# Patient Record
Sex: Female | Born: 1960 | Race: White | Hispanic: No | Marital: Married | Smoking: Never smoker
Health system: Southern US, Community
[De-identification: ages and names within clinical notes are randomized; demographics above are authoritative.]

## PROBLEM LIST (undated history)

## (undated) DIAGNOSIS — I10 Essential (primary) hypertension: Secondary | ICD-10-CM

## (undated) DIAGNOSIS — R112 Nausea with vomiting, unspecified: Secondary | ICD-10-CM

## (undated) DIAGNOSIS — R011 Cardiac murmur, unspecified: Secondary | ICD-10-CM

## (undated) DIAGNOSIS — Z79890 Hormone replacement therapy (postmenopausal): Secondary | ICD-10-CM

## (undated) DIAGNOSIS — Z9889 Other specified postprocedural states: Secondary | ICD-10-CM

## (undated) HISTORY — PX: SHOULDER ARTHROSCOPY: SHX128

## (undated) HISTORY — PX: ELBOW SURGERY: SHX618

## (undated) HISTORY — DX: Cardiac murmur, unspecified: R01.1

## (undated) HISTORY — DX: Hormone replacement therapy: Z79.890

## (undated) HISTORY — PX: KNEE ARTHROSCOPY: SHX127

## (undated) HISTORY — PX: TONSILLECTOMY: SUR1361

## (undated) HISTORY — PX: EYE SURGERY: SHX253

## (undated) HISTORY — PX: TUBAL LIGATION: SHX77

## (undated) HISTORY — PX: APPENDECTOMY: SHX54

## (undated) HISTORY — PX: POLYPECTOMY: SHX149

---

## 1998-05-15 ENCOUNTER — Emergency Department (HOSPITAL_COMMUNITY): Admission: EM | Admit: 1998-05-15 | Discharge: 1998-05-16 | Payer: Self-pay | Admitting: Emergency Medicine

## 1998-05-15 ENCOUNTER — Encounter: Payer: Self-pay | Admitting: Emergency Medicine

## 1998-10-22 ENCOUNTER — Other Ambulatory Visit: Admission: RE | Admit: 1998-10-22 | Discharge: 1998-10-22 | Payer: Self-pay | Admitting: Obstetrics and Gynecology

## 1999-09-05 ENCOUNTER — Ambulatory Visit (HOSPITAL_COMMUNITY): Admission: RE | Admit: 1999-09-05 | Discharge: 1999-09-05 | Payer: Self-pay | Admitting: Orthopedic Surgery

## 1999-11-10 ENCOUNTER — Other Ambulatory Visit: Admission: RE | Admit: 1999-11-10 | Discharge: 1999-11-10 | Payer: Self-pay | Admitting: Obstetrics and Gynecology

## 2000-02-11 ENCOUNTER — Ambulatory Visit (HOSPITAL_COMMUNITY): Admission: RE | Admit: 2000-02-11 | Discharge: 2000-02-11 | Payer: Self-pay | Admitting: Orthopedic Surgery

## 2000-11-19 ENCOUNTER — Other Ambulatory Visit: Admission: RE | Admit: 2000-11-19 | Discharge: 2000-11-19 | Payer: Self-pay | Admitting: Obstetrics and Gynecology

## 2001-12-20 ENCOUNTER — Other Ambulatory Visit: Admission: RE | Admit: 2001-12-20 | Discharge: 2001-12-20 | Payer: Self-pay | Admitting: Obstetrics and Gynecology

## 2003-01-26 ENCOUNTER — Other Ambulatory Visit: Admission: RE | Admit: 2003-01-26 | Discharge: 2003-01-26 | Payer: Self-pay | Admitting: Obstetrics and Gynecology

## 2003-11-12 ENCOUNTER — Encounter: Admission: RE | Admit: 2003-11-12 | Discharge: 2003-11-12 | Payer: Self-pay | Admitting: Obstetrics and Gynecology

## 2004-01-30 ENCOUNTER — Other Ambulatory Visit: Admission: RE | Admit: 2004-01-30 | Discharge: 2004-01-30 | Payer: Self-pay | Admitting: Obstetrics and Gynecology

## 2004-05-21 ENCOUNTER — Ambulatory Visit (HOSPITAL_COMMUNITY): Admission: RE | Admit: 2004-05-21 | Discharge: 2004-05-21 | Payer: Self-pay | Admitting: Obstetrics and Gynecology

## 2004-05-21 ENCOUNTER — Encounter (INDEPENDENT_AMBULATORY_CARE_PROVIDER_SITE_OTHER): Payer: Self-pay | Admitting: *Deleted

## 2005-02-13 ENCOUNTER — Other Ambulatory Visit: Admission: RE | Admit: 2005-02-13 | Discharge: 2005-02-13 | Payer: Self-pay | Admitting: Obstetrics and Gynecology

## 2005-03-12 ENCOUNTER — Ambulatory Visit (HOSPITAL_COMMUNITY): Admission: RE | Admit: 2005-03-12 | Discharge: 2005-03-13 | Payer: Self-pay | Admitting: Orthopedic Surgery

## 2007-04-13 ENCOUNTER — Encounter: Admission: RE | Admit: 2007-04-13 | Discharge: 2007-04-13 | Payer: Self-pay | Admitting: Obstetrics and Gynecology

## 2007-11-15 ENCOUNTER — Ambulatory Visit (HOSPITAL_BASED_OUTPATIENT_CLINIC_OR_DEPARTMENT_OTHER): Admission: RE | Admit: 2007-11-15 | Discharge: 2007-11-16 | Payer: Self-pay | Admitting: Orthopedic Surgery

## 2009-04-22 ENCOUNTER — Encounter (INDEPENDENT_AMBULATORY_CARE_PROVIDER_SITE_OTHER): Payer: Self-pay | Admitting: *Deleted

## 2009-05-21 ENCOUNTER — Ambulatory Visit: Payer: Self-pay | Admitting: Gastroenterology

## 2009-05-21 ENCOUNTER — Encounter (INDEPENDENT_AMBULATORY_CARE_PROVIDER_SITE_OTHER): Payer: Self-pay | Admitting: *Deleted

## 2009-05-21 DIAGNOSIS — F329 Major depressive disorder, single episode, unspecified: Secondary | ICD-10-CM

## 2009-05-21 DIAGNOSIS — F3289 Other specified depressive episodes: Secondary | ICD-10-CM | POA: Insufficient documentation

## 2009-05-21 DIAGNOSIS — R198 Other specified symptoms and signs involving the digestive system and abdomen: Secondary | ICD-10-CM | POA: Insufficient documentation

## 2009-05-21 DIAGNOSIS — K589 Irritable bowel syndrome without diarrhea: Secondary | ICD-10-CM

## 2009-05-21 LAB — CONVERTED CEMR LAB
ALT: 12 units/L (ref 0–35)
AST: 21 units/L (ref 0–37)
Albumin: 3.5 g/dL (ref 3.5–5.2)
Alkaline Phosphatase: 61 units/L (ref 39–117)
BUN: 11 mg/dL (ref 6–23)
Basophils Absolute: 0 10*3/uL (ref 0.0–0.1)
Basophils Relative: 0.3 % (ref 0.0–3.0)
Bilirubin, Direct: 0.1 mg/dL (ref 0.0–0.3)
CO2: 26 meq/L (ref 19–32)
Calcium: 8.4 mg/dL (ref 8.4–10.5)
Chloride: 107 meq/L (ref 96–112)
Creatinine, Ser: 0.8 mg/dL (ref 0.4–1.2)
Eosinophils Absolute: 0 10*3/uL (ref 0.0–0.7)
Eosinophils Relative: 0.3 % (ref 0.0–5.0)
Ferritin: 17 ng/mL (ref 10.0–291.0)
Folate: 7.3 ng/mL
GFR calc non Af Amer: 87.05 mL/min (ref >60)
Glucose, Bld: 85 mg/dL (ref 70–99)
HCT: 41.9 % (ref 36.0–46.0)
Hemoglobin: 14.7 g/dL (ref 12.0–15.0)
IgA: 177 mg/dL (ref 68–378)
Iron: 104 ug/dL (ref 42–145)
Lymphocytes Relative: 26.3 % (ref 12.0–46.0)
Lymphs Abs: 1.4 10*3/uL (ref 0.7–4.0)
MCHC: 35 g/dL (ref 30.0–36.0)
MCV: 93.8 fL (ref 78.0–100.0)
Monocytes Absolute: 0.2 10*3/uL (ref 0.1–1.0)
Monocytes Relative: 4.3 % (ref 3.0–12.0)
Neutro Abs: 3.6 10*3/uL (ref 1.4–7.7)
Neutrophils Relative %: 68.8 % (ref 43.0–77.0)
Platelets: 229 10*3/uL (ref 150.0–400.0)
Potassium: 3.7 meq/L (ref 3.5–5.1)
RBC: 4.47 M/uL (ref 3.87–5.11)
RDW: 13.1 % (ref 11.5–14.6)
Saturation Ratios: 32 % (ref 20.0–50.0)
Sed Rate: 11 mm/hr (ref 0–22)
Sodium: 141 meq/L (ref 135–145)
TSH: 1.39 microintl units/mL (ref 0.35–5.50)
Tissue Transglutaminase Ab, IgA: 5.4 units (ref <20)
Total Bilirubin: 0.3 mg/dL (ref 0.3–1.2)
Total Protein: 6.1 g/dL (ref 6.0–8.3)
Transferrin: 231.8 mg/dL (ref 212.0–360.0)
Vitamin B-12: 279 pg/mL (ref 211–911)
WBC: 5.2 10*3/uL (ref 4.5–10.5)

## 2009-05-27 ENCOUNTER — Ambulatory Visit: Payer: Self-pay | Admitting: Gastroenterology

## 2009-05-28 ENCOUNTER — Encounter: Payer: Self-pay | Admitting: Gastroenterology

## 2009-05-29 ENCOUNTER — Encounter: Payer: Self-pay | Admitting: Gastroenterology

## 2010-02-04 NOTE — Letter (Signed)
Summary: [facility] Instructions  [facility]  [address] [place], [place] [address]   Phone: [phone]  Fax: [phone]       Susan Ramirez    [birth year]    MRN: [id number]        Procedure Day /Date: Monday, 05/27/09     Arrival Time: 12:30      Procedure Time: 1:30     Location of Procedure:                    Susan Ramirez _  [facility] (4th Floor)                        PREPARATION FOR COLONOSCOPY WITH MOVIPREP   Starting 5 days prior to your procedure 05/22/09 do not eat nuts, seeds, popcorn, corn, beans, peas,  salads, or any raw vegetables.  Do not take any fiber supplements (e.g. Metamucil, Citrucel, and Benefiber).  THE DAY BEFORE YOUR PROCEDURE         DATE: 05/26/09  DAY: Sunday  1.  Drink clear liquids the entire day-NO SOLID FOOD  2.  Do not drink anything colored red or purple.  Avoid juices with pulp.  No orange juice.  3.  Drink at least 64 oz. (8 glasses) of fluid/clear liquids during the day to prevent dehydration and help the prep work efficiently.  CLEAR LIQUIDS INCLUDE: Water Jello Ice Popsicles Tea (sugar ok, no milk/cream) Powdered fruit flavored drinks Coffee (sugar ok, no milk/cream) Gatorade Juice: apple, white grape, white cranberry  Lemonade Clear bullion, consomm, broth Carbonated beverages (any kind) Strained chicken noodle soup Hard Candy                             4.  In the morning, mix first dose of MoviPrep solution:    Empty 1 Pouch A and 1 Pouch B into the disposable container    Add lukewarm drinking water to the top line of the container. Mix to dissolve    Refrigerate (mixed solution should be used within 24 hrs)  5.  Begin drinking the prep at 5:00 p.m. The MoviPrep container is divided by 4 marks.   Every 15 minutes drink the solution down to the next mark (approximately 8 oz) until the full liter is complete.   6.  Follow completed prep with 16 oz of clear liquid of your choice (Nothing red or  purple).  Continue to drink clear liquids until bedtime.  7.  Before going to bed, mix second dose of MoviPrep solution:    Empty 1 Pouch A and 1 Pouch B into the disposable container    Add lukewarm drinking water to the top line of the container. Mix to dissolve    Refrigerate  THE DAY OF YOUR PROCEDURE      DATE: 05/27/09   DAY: Monday  Beginning at 8:30_a.m. (5 hours before procedure):         1. Every 15 minutes, drink the solution down to the next mark (approx 8 oz) until the full liter is complete.  2. Follow completed prep with 16 oz. of clear liquid of your choice.    3. You may drink clear liquids until _  _ (2 HOURS BEFORE PROCEDURE).   MEDICATION INSTRUCTIONS  Unless otherwise instructed, you should take regular prescription medications with a small sip of water   as early as possible the morning  of your procedure.                  OTHER INSTRUCTIONS  You will need a responsible adult at least 51 years of age to accompany you and drive you home.   This person must remain in the waiting room during your procedure.  Wear loose fitting clothing that is easily removed.  Leave jewelry and other valuables at home.  However, you may wish to bring a book to read or  an iPod/MP3 player to listen to music as you wait for your procedure to start.  Remove all body piercing jewelry and leave at home.  Total time from sign-in until discharge is approximately 2-3 hours.  You should go home directly after your procedure and rest.  You can resume normal activities the  day after your procedure.  The day of your procedure you should not:   Drive   Make legal decisions   Operate machinery   Drink alcohol   Return to work  You will receive specific instructions about eating, activities and medications before you leave.    The above instructions have been reviewed and explained to me by   _______________________    I fully understand and can verbalize  these instructions _____________________________ Date _________

## 2010-02-04 NOTE — Procedures (Signed)
Summary: Colonoscopy  Patient: Susan Ramirez Note: All result statuses are Final unless otherwise noted.  Tests: (1) Colonoscopy (COL)   COL Colonoscopy           DONE     [facility]     [address]     [place], [place]  [address]           COLONOSCOPY PROCEDURE REPORT           PATIENT:  Susan Ramirez, Susan Ramirez  MR#:  [id number]     BIRTHDATE:  1960/11/11, 48 yrs. old  GENDER:  female     ENDOSCOPIST:  Vania Rea. Jarold Motto, MD, [facility]     REF. BY:  Harold Hedge, M.D.     PROCEDURE DATE:  05/27/2009     PROCEDURE:  Colonoscopy with biopsy and snare polypectomy     ASA CLASS:  Class I     INDICATIONS:  colorectal cancer screening, average risk,     unexplained diarrhea     MEDICATIONS:   Fentanyl 75 mcg IV, Versed 8 mg IV           DESCRIPTION OF PROCEDURE:   After the risks benefits and     alternatives of the procedure were thoroughly explained, informed     consent was obtained.  Digital rectal exam was performed and     revealed no abnormalities.   The LB [id number] endoscope     was introduced through the anus and advanced to the terminal ileum     which was intubated for a short distance, without limitations.     The quality of the prep was excellent, using MoviPrep.  The     instrument was then slowly withdrawn as the colon was fully     examined.     <<PROCEDUREIMAGES>>     FINDINGS:  ENDOSCOPIC FINDINGS:  A sessile polyp was found at the     splenic flexure. .5 cm flat polyp hot snare excised and to path.     This was otherwise a normal examination of the colon. RANDOM     BIOPSIES DONE.   Retroflexed views in the rectum revealed no     abnormalities.    The scope was then withdrawn from the patient     and the procedure completed.           COMPLICATIONS:  None     ENDOSCOPIC IMPRESSION:     1) Sessile polyp at the splenic flexure     2) Otherwise normal examination     1.R/O ADENOMA     2.R/O MICROSCOPIC/COLLAGENOUS COLITIS     RECOMMENDATIONS:     1)  Await pathology results     2) Repeat colonoscopy in 5 years if polyp adenomatous; otherwise     10 years     REPEAT EXAM:  No           ______________________________     Vania Rea. Jarold Motto, MD, [facility]           CC:  Mila Palmer, MD           n.     Rosalie Doctor:   Vania Rea. Patterson at 05/27/2009 02:11 PM           Delma Post, [phone]  Note: An exclamation mark (!) indicates a result that was not dispersed into the flowsheet. Document Creation Date: 05/27/2009 2:12 PM _______________________________________________________________________  (1) Order result status: Final Collection or observation date-time: 05/27/2009 14:03 Requested date-time:  Receipt date-time:  Reported date-time:  Referring Physician:   Ordering Physician: Sheryn Bison [phone]) Specimen Source:  Source: Launa Grill Order Number: [id number] Lab site:   Appended Document: Colonoscopy     Procedures Next Due Date:    Colonoscopy: 05/2014

## 2010-02-04 NOTE — Assessment & Plan Note (Signed)
Summary: DIARRHEA...AS.   History of Present Illness Visit Type: Initial Consult Primary GI MD: Sheryn Bison MD FACP FAGA Primary Provider: Mila Palmer, MD Requesting Provider: Harold Hedge, MD Chief Complaint: Change in bowels x 1 year ago. Pt states her bowels are loose but not diarrhea. Pt denies any bleeding or abd pain with BM's. Pt cannot relate the loose stools to food she eats or stress.  History of Present Illness:   Extremely pleasant 50 year old Caucasian female referred through the courtesy of Dr. Hulan Fess for evaluation of bowel urgency with diarrhea over the last year.  Rudy is in excellent health and has no chronic medical problems. She has had previous appendectomy and bilateral knee arthroscopy. She does not have any chronic medical condition that requires NSAID use, and she denies use of alcohol or cigarettes. Wrapped as good and her weight is stable and she denies any specific food intolerances. She does not use sorbitol fructose. Her GI complaints revolve around sudden urgency and inability to tell gas from stool. This happens approximately one to 2 times a week without real precipitating events. She does not have nocturnal awakening, rectal bleeding, or abnormal pain. She denies fever, chills, or other systemic complaints. She has not had previous barium studies or endoscopic exams. Family history is entirely noncontributory.  There is no associated osteoporosis, history of iron deficiency, or recurrent skin rashes or mouth sores. She has not had foreign travel or known infectious disease exposure. No sick family members at home.   GI Review of Systems    Reports bloating.      Denies abdominal pain, acid reflux, belching, chest pain, dysphagia with liquids, dysphagia with solids, heartburn, loss of appetite, nausea, vomiting, vomiting blood, weight loss, and  weight gain.      Reports change in bowel habits and  irritable bowel syndrome.     Denies anal  fissure, black tarry stools, constipation, diarrhea, diverticulosis, fecal incontinence, heme positive stool, hemorrhoids, jaundice, light color stool, liver problems, rectal bleeding, and  rectal pain.    Current Medications (verified): 1)  None  Allergies (verified): No Known Drug Allergies  Past History:  Past medical, surgical, family and social histories (including risk factors) reviewed for relevance to current acute and chronic problems.  Past Medical History: Unremarkable  Past Surgical History: Appendectomy Tonsillectomy Tubal Ligation Right elbow surgery Left shoulder surgery Bilateral Knee surgery  Family History: Reviewed history from 05/20/2009 and no changes required. Family History of Diabetes: father Family History of Heart Disease: Father  Social History: Reviewed history from 05/20/2009 and no changes required. 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  Alcohol Use - no Illicit Drug Use - no Daily Caffeine Use  Review of Systems       The patient complains of depression-new and fatigue.  The patient denies allergy/sinus, anemia, anxiety-new, arthritis/joint pain, back pain, blood in urine, breast changes/lumps, change in vision, confusion, cough, coughing up blood, fainting, fever, headaches-new, hearing problems, heart murmur, heart rhythm changes, itching, menstrual pain, muscle pains/cramps, night sweats, nosebleeds, pregnancy symptoms, shortness of breath, skin rash, sleeping problems, sore throat, swelling of feet/legs, swollen lymph glands, thirst - excessive , urination - excessive , urination changes/pain, urine leakage, vision changes, and voice change.   General:  Complains of fatigue; denies fever, chills, sweats, anorexia, weakness, malaise, weight loss, and sleep disorder. Eyes:  Denies blurring, diplopia, irritation, discharge, vision loss, scotoma, eye pain, and photophobia. ENT:  Denies earache, ear discharge, tinnitus,  decreased hearing, nasal  congestion, loss of smell, nosebleeds, sore throat, hoarseness, and difficulty swallowing. CV:  Denies chest pains, angina, palpitations, syncope, dyspnea on exertion, orthopnea, PND, peripheral edema, and claudication. Resp:  Denies dyspnea at rest, dyspnea with exercise, cough, sputum, wheezing, coughing up blood, and pleurisy. GI:  Complains of gas/bloating; denies difficulty swallowing, pain on swallowing, nausea, indigestion/heartburn, vomiting, vomiting blood, abdominal pain, jaundice, diarrhea, constipation, change in bowel habits, bloody BM's, black BMs, and fecal incontinence. GU:  Denies urinary burning, blood in urine, nocturnal urination, urinary frequency, urinary incontinence, abnormal vaginal bleeding, amenorrhea, menorrhagia, vaginal discharge, pelvic pain, genital sores, painful intercourse, and decreased libido. MS:  Complains of joint swelling and joint stiffness; denies joint pain / LOM, joint deformity, low back pain, muscle weakness, muscle cramps, muscle atrophy, leg pain at night, leg pain with exertion, and shoulder pain / LOM hand / wrist pain (CTS). Derm:  Denies rash, itching, dry skin, hives, moles, warts, and unhealing ulcers. Neuro:  Denies weakness, paralysis, abnormal sensation, seizures, syncope, tremors, vertigo, transient blindness, frequent falls, frequent headaches, difficulty walking, headache, sciatica, radiculopathy other:, restless legs, memory loss, and confusion. Psych:  Complains of depression; denies anxiety, memory loss, suicidal ideation, hallucinations, paranoia, phobia, and confusion. Endo:  Denies cold intolerance, heat intolerance, polydipsia, polyphagia, polyuria, unusual weight change, and hirsutism. Heme:  Denies bruising, bleeding, enlarged lymph nodes, and pagophagia.  Vital Signs:  Patient profile:   50 year old female Height:      65.5 inches Weight:      165 pounds BMI:     27.14 Pulse rate:   72 / minute Pulse  rhythm:   regular BP sitting:   106 / 68  (right arm) Cuff size:   regular  Vitals Entered By: Christie Nottingham CMA Duncan Dull) (May 21, 2009 9:27 AM)  Physical Exam  General:  Well developed, well nourished, no acute distress.healthy appearing.   Head:  Normocephalic and atraumatic. Eyes:  PERRLA, no icterus.exam deferred to patient's ophthalmologist.   Neck:  Supple; no masses or thyromegaly. Lungs:  Clear throughout to auscultation. Heart:  Regular rate and rhythm; no murmurs, rubs,  or bruits. Abdomen:  Soft, nontender and nondistended. No masses, hepatosplenomegaly or hernias noted. Normal bowel sounds. Rectal:  Normal exam.hemocult negative.   Msk:  Symmetrical with no gross deformities. Normal posture. Pulses:  Normal pulses noted. Extremities:  No clubbing, cyanosis, edema or deformities noted. Neurologic:  Alert and  oriented x4;  grossly normal neurologically. Skin:  Intact without significant lesions or rashes. Cervical Nodes:  No significant cervical adenopathy. Psych:  Alert and cooperative. Normal mood and affect.   Impression & Recommendations:  Problem # 1:  CHANGE IN BOWELS (JYN-829.56) Assessment Deteriorated This sounds like malabsorption of non-digestible carbohydrates from an unknown source in her diet. Have asked her to keep a food diary to see if we can find any substance that she has intolerance to. Labs have been ordered including celiac panel. Because of her age, colonoscopy has been scheduled. Also stool exams for white cells, excessive fat, and parasites have been ordered. She is use p.r.n. sublingual Levsin pending her workup. Orders: TLB-CBC Platelet - w/Differential (85025-CBCD) TLB-BMP (Basic Metabolic Panel-BMET) (80048-METABOL) TLB-Hepatic/Liver Function Pnl (80076-HEPATIC) TLB-TSH (Thyroid Stimulating Hormone) (84443-TSH) TLB-B12, Serum-Total ONLY (21308-M57) TLB-Ferritin (82728-FER) TLB-Folic Acid (Folate) (82746-FOL) TLB-IBC Pnl  (Iron/FE;Transferrin) (83550-IBC) TLB-Sedimentation Rate (ESR) (85652-ESR) TLB-IgA (Immunoglobulin A) (82784-IGA) T-Sprue Panel (Celiac Disease Aby Eval) (83516x3/[address]) T-Culture, Stool (87045/87046-70140) T-Stool Fats [place] Stain ([phone]) T-Stool for O&P (41324-40102) T-Fecal WBC (72536-64403)  Problem #  2:  DEPRESSION (ICD-311) Assessment: Comment Only This Does not seem to be a major problem at this time.  Other Orders: Colonoscopy (Colon)  Patient Instructions: 1)  Please go to the basement for lab work. 2)  Begin levsin as needed. 3)  You are scheduled for a colonoscopy. 4)  The medication list was reviewed and reconciled.  All changed / newly prescribed medications were explained.  A complete medication list was provided to the patient / caregiver. 5)  Food Gery Pray requested 6)  Copy sent to : Dr. Hulan Fess and Dr. Mila Palmer 7)  Please continue current medications.  8)  Colonoscopy and Flexible Sigmoidoscopy brochure given.  9)  Conscious Sedation brochure given.   Appended Document: DIARRHEA...AS.    Clinical Lists Changes  Medications: Added new medication of MOVIPREP 100 GM  SOLR (PEG-KCL-NACL-NASULF-NA ASC-C) As per prep instructions. - Signed Added new medication of LEVSIN/SL 0.125 MG SUBL (HYOSCYAMINE SULFATE) 1 SL q 4-6 hrs as needed - Signed Rx of MOVIPREP 100 GM  SOLR (PEG-KCL-NACL-NASULF-NA ASC-C) As per prep instructions.;  #1 x 0;  Signed;  Entered by: Ashok Cordia RN;  Authorized by: Mardella Layman MD [facility];  Method used: Electronically to [facility]  [address] [phone]*, [address] [place], [place], [place]  [address], Ph: [phone] or [phone], Fax: [phone] Rx of LEVSIN/SL 0.125 MG SUBL (HYOSCYAMINE SULFATE) 1 SL q 4-6 hrs as needed;  #40 x 3;  Signed;  Entered by: Ashok Cordia RN;  Authorized by: Mardella Layman MD [facility];  Method used: Electronically to [facility]  [address] [phone]*, [address] [place],  [place], [place]  [address], Ph: [phone] or [phone], Fax: [phone]    Prescriptions: LEVSIN/SL 0.125 MG SUBL (HYOSCYAMINE SULFATE) 1 SL q 4-6 hrs as needed  #40 x 3   Entered by:   Ashok Cordia RN   Authorized by:   Mardella Layman MD [facility]   Signed by:   Ashok Cordia RN on 05/21/2009   Method used:   [facility]ronically to        [facility]  [address] [phone]* (retail)       [address]       [place], [place]  [address]       Ph: [phone] or [phone]       Fax: [phone]   RxID:   [id number] MOVIPREP 100 GM  SOLR (PEG-KCL-NACL-NASULF-NA ASC-C) As per prep instructions.  #1 x 0   Entered by:   Ashok Cordia RN   Authorized by:   Mardella Layman MD [facility]   Signed by:   Ashok Cordia RN on 05/21/2009   Method used:   [facility]ron[facility]        [facility]  [address] [phone]* (retail)       [address]       [place], [place]  [address]       Ph: [phone] or [phone]       Fax: [phone]   RxID:   [id number]    Appended Document: DIARRHEA...AS. COLON BIOPSIES SHOW MELANOSIS COLI !!!!!COPY DR. Paulino Rily AND TOMBLIN...  Appended Document: DIARRHEA...AS. Copy faxed.

## 2010-02-04 NOTE — Letter (Signed)
Summary: New Patient letter  [facility]  [address] [place], [place] [address]   Phone: [phone]  Fax: [phone]       04/22/2009 MRN: [id number]  Enna Vanwagner [address] [place], [place]  [address]  Dear Ms. Silsby,  Welcome to the Gastroenterology Division at [facility].    You are scheduled to see Dr.  Jarold Motto on 05/17/2009 at 9:30AM on the 3rd floor at [facility], [address]  We ask that you try to arrive at our office 15 minutes prior to your appointment time to allow for check-in.  We would like you to complete the enclosed self-administered evaluation form prior to your visit and bring it with you on the day of your appointment.  We will review it with you.  Also, please bring a complete list of all your medications or, if you prefer, bring the medication bottles and we will list them.  Please bring your insurance card so that we may make a copy of it.  If your insurance requires a referral to see a specialist, please bring your referral form from your primary care physician.  Co-payments are due at the time of your visit and may be paid by cash, check or credit card.     Your office visit will consist of a consult with your physician (includes a physical exam), any laboratory testing he/she may order, scheduling of any necessary diagnostic testing (e.g. x-ray, ultrasound, CT-scan), and scheduling of a procedure (e.g. Endoscopy, Colonoscopy) if required.  Please allow enough time on your schedule to allow for any/all of these possibilities.    If you cannot keep your appointment, please call [phone] to cancel or reschedule prior to your appointment date.  This allows [place] the opportunity to schedule an appointment for another patient in need of care.  If you do not cancel or reschedule by 5 p.m. the business day prior to your appointment date, you will be charged a $50.00 late cancellation/no-show fee.    Thank you for choosing  [facility] for your medical needs.  We appreciate the opportunity to care for you.  Please visit [place] at our website  to learn more about our practice.                     Sincerely,                                                             The Gastroenterology Division

## 2010-02-04 NOTE — Letter (Signed)
Summary: Patient Notice- Polyp Results  [facility]  [address] [place], [place] [address]   Phone: [phone]  Fax: [phone]        May 29, 2009 MRN: [id number]    MAHLET JERGENS [address] [place], [place]  [address]    Dear Ms. Barnett,  I am pleased to inform you that the colon polyp(s) removed during your recent colonoscopy was (were) found to be benign (no cancer detected) upon pathologic examination.  I recommend you have a repeat colonoscopy examination in 5_ years to look for recurrent polyps, as having colon polyps increases your risk for having recurrent polyps or even colon cancer in the future.  Should you develop new or worsening symptoms of abdominal pain, bowel habit changes or bleeding from the rectum or bowels, please schedule an evaluation with either your primary care physician or with me.  Additional information/recommendations:  __ No further action with gastroenterology is needed at this time. Please      follow-up with your primary care physician for your other healthcare      needs.  __ Please call [phone] to schedule a return visit to review your      situation.  __ Please keep your follow-up visit as already scheduled.  _XX_ Continue treatment plan as outlined the day of your exam.  Please call us if you are having persistent problems or have questions about your condition that have not been fully answered at this time.  Sincerely,  Mardella Layman MD [facility]  This letter has been electronically signed by your physician.  Appended Document: Patient Notice- Polyp Results letter mailed.

## 2010-04-25 ENCOUNTER — Other Ambulatory Visit: Payer: Self-pay | Admitting: Obstetrics and Gynecology

## 2010-04-25 DIAGNOSIS — R928 Other abnormal and inconclusive findings on diagnostic imaging of breast: Secondary | ICD-10-CM

## 2010-04-29 ENCOUNTER — Ambulatory Visit
Admission: RE | Admit: 2010-04-29 | Discharge: 2010-04-29 | Disposition: A | Discharge status: Home / Self Care | Payer: 59 | Source: Ambulatory Visit | Attending: Obstetrics and Gynecology | Admitting: Obstetrics and Gynecology

## 2010-04-29 DIAGNOSIS — R928 Other abnormal and inconclusive findings on diagnostic imaging of breast: Secondary | ICD-10-CM

## 2010-05-20 NOTE — Op Note (Signed)
Susan Ramirez, Susan Ramirez                 ACCOUNT NO.:  [id number]   MEDICAL RECORD NO.:  [id number]          PATIENT TYPE:  AMB   LOCATION:  [facility]                         FACILITY:  [facility]   PHYSICIAN:  Marlowe Kays, M.D.  DATE OF BIRTH:  1960/04/01   DATE OF PROCEDURE:  11/15/2007  DATE OF DISCHARGE:                               OPERATIVE REPORT   PREOPERATIVE DIAGNOSIS:  Chronic impingement syndrome, left shoulder  with rotator cuff tendinopathy.   POSTOPERATIVE DIAGNOSIS:  Chronic impingement syndrome, left shoulder  with rotator cuff tendinopathy.   OPERATION:  Left shoulder arthroscopy (normal glenohumeral examination  followed by arthroscopic subacromial decompression and distal clavicle  decompression).   SURGEON:  J. Aplington, M.D.   ASSISTANT:  Mr. Idolina Primer, [facility].   ANESTHESIA:  General.   PATHOLOGY AND JUSTIFICATION FOR PROCEDURE:  Because of a painful left  shoulder, she had an MRI performed on October 22, 2007 demonstrating  impingement on the rotator cuff, both from a curved acromion and from  the distal clavicle.  This was associated with some mild rotator cuff  tendinopathy/ tendinosis.  She had no tears of the [facility] joint with a  normal looking plain x-ray and it was felt that she did not need distal  clavicle resection, but only planing of the underneath surface.   PROCEDURE:  After satisfactory general anesthesia with the patient in a  beach-chair position on the sliding frame, the left shoulder girdle was  prepped with DuraPrep and draped as a sterile field.  A time-out was  performed.  I marked out the anatomy of the left shoulder including  lateral and posterior portals, both which I infiltrated with 0.5%  Marcaine with adrenaline as well as the subacromial space.  Through a  posterior soft spot portal, I atraumatically entered the glenohumeral  joint which was normal on examination, representative pictures were  taken.  I then redirected the scope in the  subacromial space and through  a lateral portal introduced a 4.2 shaver.  She had a moderate amount of  bursitis present and, once I resected this with a 4.2 shaver, we got a  good look at the impingement process, which was moderately severe.  I  then brought in the ArthroCare 90 degree vaporizer and began removing  soft tissue from the underneath surface of the acromion and also the  distal clavicle which was identified with downward pressure by Mr.  Underwood on the distal clavicle externally.  Following removal of a  good bit of the soft tissue, including one particular fibrous band,  which we pictured, I then brought in the 4-mm oval bur and began burring  down the underneath surface of the acromion and distal clavicle and went  back-and-forth between the bur and the vaporizer until we had wide  decompression as evidenced by pictures with her arm to her side and arm  abducted and bleeding was controlled.  I then removed all fluid possible  and we re-infiltrated the subacromial space.  I then reinjected the  subacromial space and the two portals with the Marcaine with adrenaline.  The two  portals were closed with 4-0 nylon followed by Betadine and  Adaptic pressure dressing and a shoulder immobilizer.  She tolerated the  procedure well and at the time of this dictation was on her way to the  recovery room in satisfactory condition with no known complications.           ______________________________  Marlowe Kays, M.D.     JA/MEDQ  D:  11/15/2007  T:  11/15/2007  Job:  [id number]

## 2010-05-23 NOTE — Op Note (Signed)
NAME:  Susan Ramirez, Susan Ramirez                 ACCOUNT NO.:  [id number]   MEDICAL RECORD NO.:  [id number]          PATIENT TYPE:  AMB   LOCATION:  [facility]                           FACILITY:  [facility]   PHYSICIAN:  Guy Sandifer. Henderson Cloud, M.D. DATE OF BIRTH:  [birth year]   DATE OF PROCEDURE:  05/21/2004  DATE OF DISCHARGE:                                 OPERATIVE REPORT   PREOPERATIVE DIAGNOSIS:  Desires permanent sterilization.   POSTOPERATIVE DIAGNOSIS:  Desires permanent sterilization.   PROCEDURE:  Laparoscopy with bilateral tubal ligation with Filshie clips and  biopsy of left fallopian tube.   SURGEON:  Guy Sandifer. Henderson Cloud, M.D.   ANESTHESIA:  General with endotracheal intubation.   ESTIMATED BLOOD LOSS:  None.   SPECIMENS:  Biopsy of left fallopian tube.   INDICATIONS AND CONSENT:  The patient is a 50 year old married white female,  G1, P1, desiring permanent sterilization.  Details are dictated in the  history and physical.  Preoperatively, alternative methods of contraception  as well as potential risks include but are not limited to infection, bowel,  bladder or ureteral damage, bleeding requiring transfusion of blood products  with possible transfusion reaction, HIV and hepatitis acquisition, DVT, PE,  pneumonia, have been discussed.  The permanence of the procedure as well as  failure rate and increased ectopic risks have also been reviewed.  All  questions have been answered and consent is signed on the chart.   FINDINGS:  The upper abdomen is grossly normal.  The uterus is normal in  size and contour.  The ovaries were normal bilaterally.  The right tube is  normal.  The left tube has a 2 cm smooth translucent pedunculated paratubal  cyst.   PROCEDURE:  The patient was taken to the operating room where she was  identified and placed in the dorsal supine position and general anesthesia  is induced via endotracheal intubation.  She was then placed in the dorsal  lithotomy position where she  was prepped abdominally and vaginally.  Her  bladder was straight catheterized.  A Hulka tenaculum was placed in the  uterus as a manipulator.  She was draped in a sterile fashion.  A small  infraumbilical incision was made and a disposable Veress needle was placed  without difficulty.  Syringe and drop test were normal.  2 liters of gas  were insufflated under low pressure with good tympany in the right upper  quadrant.  The Veress needle was removed and replaced with a disposable  10/11 bladeless trocar sleeve.  Placement was verified with the laparoscope  and no damage to the surrounding structures was noted.  A small suprapubic  incision was made and a 5 mm disposable bladeless trocar was placed under  direct visualization without difficulty.  The above findings were noted.  The stalk of the left paratubal cyst was then cauterized with bipolar  cautery and the cyst was resected without difficulty.  It was removed and  sent to pathology.  Then, 10 mL total of 0.5% plain Marcaine was dripped on  the fallopian tubes bilaterally.  The right fallopian  tube was then  identified from cornua to fimbria and a Filshie clip was placed on the  proximal 1/3 of the tube.  A similar procedure was carried out on the left  side.  After removal of the Filshie applicator, careful inspection reveals  the entire width of the tube to be within the clip.  The heel of the clip is  also easily seen through the mesosalpinx bilaterally.  The suprapubic trocar  sleeve is removed, pneumoperitoneum is reduced, and good hemostasis is noted  all around.  After the pneumoperitoneum is completely down, all instruments  are removed.  Both incisions are injected with 0.5% plain Marcaine.  A 2-0  Vicryl suture is then used in a single subcuticular and single through and  through on the skin suture to close the umbilical incision and to obtain  hemostasis.  Dermabond is then applied to both incisions.  The Hulka  tenaculum  is removed and good hemostasis is noted there, as well.  All  counts were correct.  The patient was awakened and taken to the recovery  room in stable condition.      JET/MEDQ  D:  05/21/2004  T:  05/21/2004  Job:  [id number]

## 2010-05-23 NOTE — Op Note (Signed)
Susan Ramirez, Susan Ramirez                 ACCOUNT NO.:  [id number]   MEDICAL RECORD NO.:  [id number]          PATIENT TYPE:  AMB   LOCATION:  DAY                          FACILITY:  [facility]   PHYSICIAN:  Marlowe Kays, M.D.  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   POSTOP DIAGNOSIS:  1.  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  2.  Medial shelf and suprapatellar plicae left knee.   OPERATION:  1.  Left knee arthroscopy with excision of subpatellar plicae and      debridement of patella.  2.  Open lateral release.   SURGEON:  Marlowe Kays, M.D.   ASSISTANT:  None.   ANESTHESIA:  General by anesthesiologist.   DESCRIPTION OF PROCEDURE:  She had identical pathology in her right knee  which I treated in 2001 and 2002.  I initially had performed an arthroscopic  lateral release, but when this did not completely correct her problem; we  ended up doing an open lateral release which is why we are performing an  open lateral release today. X-rays demonstrate that in her right knee her  patella centrally is located, but in her left knee it is considerably  lateral deviated.   Prophylactic antibiotic, satisfactory general anesthesia, pneumatic  tourniquet. Leg was Esmarched out nonsterilely and set up first for the  usual arthroscopic procedure with a thigh stabilizer, Ace wrap, and knee  support for her right knee. The left leg was prepped with DuraPrep from  thigh stabilizer to ankle and draped in a sterile field. Superior medial  saline inflow first through an anterolateral portal, medial compartment of  the knee joint was evaluated and was basically normal. Representative  picture was taken. I then looked up in the medial gutter and suprapatellar  area. She had a large medial shelf plica,  some fibrous bands, in the lateral  suprapatellar area, and grade 3/4 chondromalacia of the medial half of the  patella, articular surface. Using arthroscopic scissors, I first resected  the bands laterally; and cut the plica medially, and then resected the  remnants with the 3.5 shaver which was also used to smooth down the patellar  surface until smooth.   Final pictures were taken. I then reversed portals. The lateral compartment  of the knee joint was normal in appearance and a representative picture was  taken. I then removed all fluid possible from the knee joint; and with the  foot on a Mayo stand, marked out a lateral release incision incorporating  the lateral portal. After going through the skin and subcutaneous tissue, I  went down to the retinaculum and capsule which I opened with cutting  cautery, going from roughly just passed the anterolateral portal to above  the superior pole of the patella. Then I infiltrated the portals with 1/2%  Marcaine with adrenalin; and also the incision, which I closed with  interrupted 3-0 Vicryl subcutaneously and interrupted 4-0  nylon mattress  sutures in the skin, and the 2 anterior portals. I then injected the  remaining Marcaine with adrenalin and 4 mg of morphine through the inflow  apparatus which I removed and closed  this portals with nylon as well. Betadine Adaptic dry sterile dressings were  applied. Tourniquet was released. She was placed on knee immobilizer and  taken to the recovery room in satisfactory condition with no known  complications.           ______________________________  Marlowe Kays, M.D.     JA/MEDQ  D:  03/12/2005  T:  03/13/2005  Job:  [id number]

## 2010-05-23 NOTE — Op Note (Signed)
[facility]  Patient:    Susan Ramirez, Susan Ramirez                        MRN: [id number] Proc. Date: 02/11/00 Attending:  Fayrene Fearing P. Aplington, M.D.                           Operative Report  PREOPERATIVE DIAGNOSIS:  Patellofemoral dysfunction, right knee.  POSTOPERATIVE DIAGNOSIS:  Patellofemoral dysfunction, right knee.  OPERATION PERFORMED:  Lateral arthrotomy, right knee with 1) lateral release, 2) resection of thickened portions of lateral joint capsule, 3) minimal debridement of lateral facet patella.  SURGEON:  Dr. Simonne Come.  ASSISTANT:  Nurse.  ANESTHESIA:  General.  PATHOLOGY AND JUSTIFICATION FOR PROCEDURE:  The original procedure was arthroscopically done on September 05, 1999 with burring of a spur in the lateral facet of the patella as well as a good bit of debridement of fibrous tissue in the joint. Over the last few months, she has noted progressive problems with the fibrous band shifting laterally on flexion and extension of the knee which is painful for her. This appeared to be extra-articular. The plan was to do a lateral arthrotomy with open lateral release and resection of any fibrous band noted and also look at the lateral facet of the patella.  DESCRIPTION OF PROCEDURE:  Satisfactory general anesthesia, pneumatic tourniquet, Duraprep from tourniquet to ankle was draped in a sterile field. I made a 5 cm lateral incision centralized at the prominent lateral facet of the patella carried down through the subcutaneous tissue. The lateral joint capsule was opened with cutting cautery so that a lateral release was performed in this way. I looked at the lateral facet of the patella with some minimal prominence being resected with a small rongeur. She did have some chondromalacia of the lateral portion of the patella and particularly in the patellofemoral groove. The lateral capsule was quite thick and fibrotic and was consistent with the clinical  picture we had noted, and I excised all this thickened capsule on both sides of the capsular incision until we had smooth normal capsule remaining. The wound was then irrigated with antibiotic solution, the capsule was left open. The subcutaneous tissue was closed with #1 Vicryl in the deep layer, 3-0 in the subcutaneous tissue superficially and interrupted 4-0 nylon mattress sutures in the skin. Betadine Adaptic dry sterile dressing and a knee immobilizer were applied. The tourniquet was released. The wound was also infiltrated with 0.5% Marcaine with adrenaline. The patient tolerated the procedure well and was taken to the recovery room in satisfactory condition with no known complications. DD:  02/11/00 TD:  02/12/00 Job: [id number]

## 2010-05-23 NOTE — Op Note (Signed)
[facility]  Patient:    Susan Ramirez, Susan Ramirez                      MRN: [id number] Proc. Date: 09/05/99 Adm. Date:  [id number] Disc. Date: [id number] Attending:  Marlowe Kays Page                           Operative Report  PREOPERATIVE DIAGNOSIS:  Lateral patellar pain right knee secondary to arch patellar spur.  POSTOPERATIVE DIAGNOSIS:  Lateral patellar pain right knee secondary to arch patellar spur.  OPERATION:  Right knee arthroscopy with burring and shaving of right patella.  SURGEON:  Illene Labrador. Aplington, M.D.  ASSISTANT:  Nurse.  ANESTHESIA:  General.  PATHOLOGY AND JUSTIFICATION FOR PROCEDURE:  She had a right knee arthroscopy done perhaps 10 years ago with debridement.  Recently she has noted progressive pain over the outer aspect of the right knee with something moving on flexion extension of the knee.  A subluxation view x-ray of her knee demonstrated a large patellar spur laterally which would be impinging on the lateral femoral condyle and would seem to reproduce her symptoms. Accordingly, she is here today for excision of this spur.  See operative description below for additional details.  DESCRIPTION OF PROCEDURE:  Satisfactory general anesthesia, pneumatic tourniquet, thigh stabilizer.  The knee was prepped with DuraPrep and draped in a sterile field.  Superomedial saline inflow.  First, through an anterolateral port of the medial compartment, the knee joint was evaluated and was normal on probing.  Looking up in the medial gutter and suprapatellar area, some wear of the mid portion of the patella was noted and shaved down until smooth with the 3.5 shaver.  I could not see the lateral facet of the patella well with the scope in the anterolateral position.  Her ACL was intact.  I then reversed portals and looking through the anteromedial portal, the lateral compartment of the knee joint was normal except for some very minimal  synovitis which I dissected.  I then looked up the lateral gutter and over the lateral facet of the patella, there was a good bit of reactive fibrotic material which I pictured, and I then began shaving this out and shaving the lateral portion of the patella much as we would for an arthroscopic subacromial decompression.  I then used the 4.0 bur and began removing the lateral projection of the patella, and I went back and forth between the bur and the shaver, removing soft tissue and bone until there was a nice level lateral portion of the patella.  With the knee in extension, there was no evidence for any impingement.  I also flexor extended the knee and found a fibrous band moving over the lateral femoral condyle, and this was resected as well.  At the conclusion of the case, on flexor extension of the knee, there was nothing movable that I could see in the lateral joint and on extension, there was a nice clearance between the lateral patella and the lateral femoral condyle.  Final pictures were taken.  The knee joint was irrigated until clear and all fluid removed.  The two anterior portals were closed with 4-0 nylon.  Then 20 cc of 1/2% Marcaine with adrenalin and 4 mg of morphine were then instilled through the inflow apparatus, which was removed, and this portal closed with 4-0 nylon as well.  Betadine and Adaptic dry sterile dressing  was applied.  Tourniquet was released.  She tolerated the procedure well and was taken to the recovery room in satisfactory condition with no known complications. DD:  09/05/99 TD:  09/07/99 Job: [id number]

## 2010-05-23 NOTE — H&P (Signed)
NAME:  Susan Ramirez, Susan Ramirez                 ACCOUNT NO.:  [id number]   MEDICAL RECORD NO.:  [id number]          PATIENT TYPE:  AMB   LOCATION:  [facility]C                           FACILITY:  [facility]   PHYSICIAN:  Guy Sandifer. Henderson Cloud, M.D. DATE OF BIRTH:  March 18, 1960   DATE OF ADMISSION:  05/21/2004  DATE OF DISCHARGE:                                HISTORY & PHYSICAL   CHIEF COMPLAINT:  Desires permanent sterilization.   HISTORY OF PRESENT ILLNESS:  This patient is a 50 year old married, white  female, G1 P1, who desires permanent sterilization.  Alternate methods of  contraception have been reviewed.  Laparoscopy with Filshie clip application  has been reviewed preoperatively.  Potential risks and complications have  also been reviewed.   PAST MEDICAL HISTORY:  1.  Cervical dysplasia.  2.  History of herpes simplex virus.  3.  History of headache.   PAST SURGICAL HISTORY:  1.  Tonsillectomy as child.  2.  Appendectomy at age 38.  3.  Laser vaporization of cervix.   OBSTETRIC HISTORY:  Vaginal delivery x1.   FAMILY HISTORY:  Diabetes in father.  Diabetes in paternal grandmother.  Chronic hypertension in mother.  Asthma in maternal grandmother.   SOCIAL HISTORY:  Denies tobacco, alcohol or drug abuse.   MEDICATIONS:  Vitamins.   ALLERGIES:  NO KNOWN DRUG ALLERGIES.   REVIEW OF SYSTEMS:  NEUROLOGICAL:  History of headache as above.  CARDIAC:  Denies chest pain.  PULMONARY:  Denies shortness of breath.  GASTROINTESTINAL:  Denies recent changes in bowel habits.   PHYSICAL EXAMINATION:  VITAL SIGNS:  Height 5 feet 6 inches, weight 162  pounds, blood pressure 122/80.  HEENT:  Without thyromegaly.  LUNGS:  Clear to auscultation.  HEART:  A regular rate and rhythm.  BACK:  Without CVA tenderness.  BREASTS:  Without masses, retraction or discharge.  ABDOMEN:  Soft and nontender without masses.  PELVIC:  Vulva, vagina and cervix without lesions.  Uterus normal size,  mobile, nontender.  Adnexa  nontender without masses.  EXTREMITIES/NEUROLOGIC:  Exams grossly within normal limits.   ASSESSMENT:  Desires permanent sterilization.   PLAN:  Laparoscopy  with tubal ligation with Filshie clips.      JET/MEDQ  D:  05/15/2004  T:  05/15/2004  Job:  [id number]

## 2011-10-13 ENCOUNTER — Other Ambulatory Visit: Payer: Self-pay | Admitting: Obstetrics and Gynecology

## 2011-10-19 ENCOUNTER — Other Ambulatory Visit: Payer: Self-pay | Admitting: Dermatology

## 2012-10-11 ENCOUNTER — Other Ambulatory Visit: Payer: Self-pay | Admitting: Obstetrics and Gynecology

## 2012-10-11 DIAGNOSIS — R928 Other abnormal and inconclusive findings on diagnostic imaging of breast: Secondary | ICD-10-CM

## 2012-10-25 ENCOUNTER — Ambulatory Visit
Admission: RE | Admit: 2012-10-25 | Discharge: 2012-10-25 | Disposition: A | Discharge status: Home / Self Care | Payer: 59 | Source: Ambulatory Visit | Attending: Obstetrics and Gynecology | Admitting: Obstetrics and Gynecology

## 2012-10-25 DIAGNOSIS — R928 Other abnormal and inconclusive findings on diagnostic imaging of breast: Secondary | ICD-10-CM

## 2012-11-10 ENCOUNTER — Encounter (HOSPITAL_COMMUNITY): Payer: Self-pay | Admitting: Pharmacy Technician

## 2012-11-16 ENCOUNTER — Encounter (HOSPITAL_COMMUNITY): Payer: Self-pay

## 2012-11-16 ENCOUNTER — Encounter (HOSPITAL_COMMUNITY)
Admission: RE | Admit: 2012-11-16 | Discharge: 2012-11-16 | Disposition: A | Discharge status: Home / Self Care | Payer: 59 | Source: Ambulatory Visit | Attending: Orthopedic Surgery | Admitting: Orthopedic Surgery

## 2012-11-16 HISTORY — DX: Other specified postprocedural states: Z98.890

## 2012-11-16 HISTORY — DX: Other specified postprocedural states: R11.2

## 2012-11-16 LAB — BASIC METABOLIC PANEL
CO2: 23 mEq/L (ref 19–32)
Calcium: 9.3 mg/dL (ref 8.4–10.5)
Creatinine, Ser: 0.84 mg/dL (ref 0.50–1.10)
GFR calc non Af Amer: 78 mL/min — ABNORMAL LOW (ref >90)
Glucose, Bld: 93 mg/dL (ref 70–99)
Sodium: 140 mEq/L (ref 135–145)

## 2012-11-16 LAB — CBC
MCH: 32.6 pg (ref 26.0–34.0)
MCHC: 34.7 g/dL (ref 30.0–36.0)
MCV: 93.9 fL (ref 78.0–100.0)
Platelets: 244 10*3/uL (ref 150–400)
RBC: 4.73 MIL/uL (ref 3.87–5.11)
RDW: 12.1 % (ref 11.5–15.5)

## 2012-11-16 LAB — HCG, SERUM, QUALITATIVE: Preg, Serum: NEGATIVE

## 2012-11-16 MED ORDER — CHLORHEXIDINE GLUCONATE 4 % EX LIQD: 60.0000 mL | Freq: Once | CUTANEOUS | Status: DC

## 2012-11-16 MED ORDER — CEFAZOLIN SODIUM-DEXTROSE 2-3 GM-% IV SOLR
2.0000 g | INTRAVENOUS | Status: AC
  Administered 2012-11-17: 2 g via INTRAVENOUS
  Filled 2012-11-16: qty 50

## 2012-11-16 NOTE — Pre-Procedure Instructions (Signed)
Susan Ramirez  11/16/2012   Your procedure is scheduled on:  November 17, 2012  Report to [facility] (Entrance A) at 7:30 AM.  Call this number if you have problems the morning of surgery: [phone]   Remember:   Do not eat food or drink liquids after midnight.   Take these medicines the morning of surgery with A SIP OF WATER: none   Do not wear jewelry, make-up or nail polish.  Do not wear lotions, powders, or perfumes. You may wear deodorant.  Do not shave 48 hours prior to surgery. Men may shave face and neck.  Do not bring valuables to the hospital.  [facility] is not responsible                  for any belongings or valuables.               Contacts, dentures or bridgework may not be worn into surgery.  Leave suitcase in the car. After surgery it may be brought to your room.  For patients admitted to the hospital, discharge time is determined by your                treatment team.               Patients discharged the day of surgery will not be allowed to drive  home.  Name and phone number of your driver:   Special Instructions: Shower using CHG 2 nights before surgery and the night before surgery.  If you shower the day of surgery use CHG.  Use special wash - you have one bottle of CHG for all showers.  You should use approximately 1/3 of the bottle for each shower.   Please read over the following fact sheets that you were given: Pain Booklet, Coughing and Deep Breathing and Surgical Site Infection Prevention

## 2012-11-17 ENCOUNTER — Encounter (HOSPITAL_COMMUNITY)
Admission: RE | Disposition: A | Discharge status: Home / Self Care | Payer: Self-pay | Source: Ambulatory Visit | Attending: Orthopedic Surgery

## 2012-11-17 ENCOUNTER — Ambulatory Visit (HOSPITAL_COMMUNITY)
Admission: RE | Admit: 2012-11-17 | Discharge: 2012-11-17 | Disposition: A | Discharge status: Home / Self Care | Payer: 59 | Source: Ambulatory Visit | Attending: Orthopedic Surgery | Admitting: Orthopedic Surgery

## 2012-11-17 ENCOUNTER — Encounter (HOSPITAL_COMMUNITY): Payer: 59 | Admitting: Anesthesiology

## 2012-11-17 ENCOUNTER — Ambulatory Visit (HOSPITAL_COMMUNITY): Payer: 59 | Admitting: Anesthesiology

## 2012-11-17 ENCOUNTER — Encounter (HOSPITAL_COMMUNITY): Payer: Self-pay | Admitting: *Deleted

## 2012-11-17 DIAGNOSIS — M19019 Primary osteoarthritis, unspecified shoulder: Secondary | ICD-10-CM | POA: Insufficient documentation

## 2012-11-17 DIAGNOSIS — M67919 Unspecified disorder of synovium and tendon, unspecified shoulder: Secondary | ICD-10-CM | POA: Insufficient documentation

## 2012-11-17 DIAGNOSIS — M719 Bursopathy, unspecified: Secondary | ICD-10-CM | POA: Insufficient documentation

## 2012-11-17 DIAGNOSIS — M249 Joint derangement, unspecified: Secondary | ICD-10-CM | POA: Insufficient documentation

## 2012-11-17 DIAGNOSIS — M25819 Other specified joint disorders, unspecified shoulder: Secondary | ICD-10-CM | POA: Insufficient documentation

## 2012-11-17 HISTORY — PX: SHOULDER ARTHROSCOPY WITH DISTAL CLAVICLE RESECTION: SHX5675

## 2012-11-17 SURGERY — SHOULDER ARTHROSCOPY WITH DISTAL CLAVICLE RESECTION
Anesthesia: Regional | Site: Shoulder | Laterality: Right | Wound class: Clean

## 2012-11-17 MED ORDER — ONDANSETRON HCL 4 MG/2ML IJ SOLN
INTRAMUSCULAR | Status: DC | PRN
  Administered 2012-11-17: 4 mg via INTRAVENOUS

## 2012-11-17 MED ORDER — FENTANYL CITRATE 0.05 MG/ML IJ SOLN
INTRAMUSCULAR | Status: DC | PRN
  Administered 2012-11-17: 50 ug via INTRAVENOUS

## 2012-11-17 MED ORDER — LIDOCAINE HCL (CARDIAC) 20 MG/ML IV SOLN
INTRAVENOUS | Status: DC | PRN
  Administered 2012-11-17: 60 mg via INTRAVENOUS

## 2012-11-17 MED ORDER — DIAZEPAM 2 MG PO TABS: 2.0000 mg | ORAL_TABLET | Freq: Four times a day (QID) | ORAL | Status: DC | PRN

## 2012-11-17 MED ORDER — PROMETHAZINE HCL 25 MG PO TABS: 25.0000 mg | ORAL_TABLET | Freq: Four times a day (QID) | ORAL | Status: DC | PRN

## 2012-11-17 MED ORDER — MIDAZOLAM HCL 2 MG/2ML IJ SOLN
1.0000 mg | INTRAMUSCULAR | Status: DC | PRN
  Administered 2012-11-17: 2 mg via INTRAVENOUS

## 2012-11-17 MED ORDER — MIDAZOLAM HCL 2 MG/2ML IJ SOLN
INTRAMUSCULAR | Status: AC
  Administered 2012-11-17: 2 mg via INTRAVENOUS
  Filled 2012-11-17: qty 2

## 2012-11-17 MED ORDER — PROPOFOL 10 MG/ML IV BOLUS
INTRAVENOUS | Status: DC | PRN
  Administered 2012-11-17: 150 mg via INTRAVENOUS

## 2012-11-17 MED ORDER — LACTATED RINGERS IV SOLN
INTRAVENOUS | Status: DC
  Administered 2012-11-17: 50 mL/h via INTRAVENOUS

## 2012-11-17 MED ORDER — FENTANYL CITRATE 0.05 MG/ML IJ SOLN
INTRAMUSCULAR | Status: AC
  Administered 2012-11-17: 100 ug via INTRAVENOUS
  Filled 2012-11-17: qty 2

## 2012-11-17 MED ORDER — TEMAZEPAM 30 MG PO CAPS: 30.0000 mg | ORAL_CAPSULE | Freq: Every evening | ORAL | Status: DC | PRN

## 2012-11-17 MED ORDER — NEOSTIGMINE METHYLSULFATE 1 MG/ML IJ SOLN
INTRAMUSCULAR | Status: DC | PRN
  Administered 2012-11-17: 4 mg via INTRAVENOUS

## 2012-11-17 MED ORDER — HYDROMORPHONE HCL PF 1 MG/ML IJ SOLN: 0.2500 mg | INTRAMUSCULAR | Status: DC | PRN

## 2012-11-17 MED ORDER — FENTANYL CITRATE 0.05 MG/ML IJ SOLN
50.0000 ug | INTRAMUSCULAR | Status: DC | PRN
  Administered 2012-11-17: 100 ug via INTRAVENOUS

## 2012-11-17 MED ORDER — ROCURONIUM BROMIDE 100 MG/10ML IV SOLN
INTRAVENOUS | Status: DC | PRN
  Administered 2012-11-17: 50 mg via INTRAVENOUS

## 2012-11-17 MED ORDER — SODIUM CHLORIDE 0.9 % IR SOLN
Status: DC | PRN
  Administered 2012-11-17: 6000 mL

## 2012-11-17 MED ORDER — OXYCODONE-ACETAMINOPHEN 5-325 MG PO TABS
1.0000 | ORAL_TABLET | ORAL | Status: DC | PRN
Start: 2012-11-17 — End: 2014-08-02

## 2012-11-17 MED ORDER — NAPROXEN 500 MG PO TABS: 500.0000 mg | ORAL_TABLET | Freq: Two times a day (BID) | ORAL | Status: DC

## 2012-11-17 MED ORDER — OXYCODONE HCL 5 MG/5ML PO SOLN: 5.0000 mg | Freq: Once | ORAL | Status: DC | PRN

## 2012-11-17 MED ORDER — OXYCODONE HCL 5 MG PO TABS: 5.0000 mg | ORAL_TABLET | Freq: Once | ORAL | Status: DC | PRN

## 2012-11-17 MED ORDER — LACTATED RINGERS IV SOLN
INTRAVENOUS | Status: DC | PRN
  Administered 2012-11-17: 09:00:00 via INTRAVENOUS

## 2012-11-17 MED ORDER — GLYCOPYRROLATE 0.2 MG/ML IJ SOLN
INTRAMUSCULAR | Status: DC | PRN
  Administered 2012-11-17: .6 mg via INTRAVENOUS

## 2012-11-17 MED ORDER — ONDANSETRON HCL 4 MG/2ML IJ SOLN
INTRAMUSCULAR | Status: AC
  Filled 2012-11-17: qty 2

## 2012-11-17 MED ORDER — BUPIVACAINE-EPINEPHRINE PF 0.5-1:200000 % IJ SOLN
INTRAMUSCULAR | Status: DC | PRN
  Administered 2012-11-17: 30 mL

## 2012-11-17 MED ORDER — ONDANSETRON HCL 4 MG/2ML IJ SOLN
4.0000 mg | Freq: Four times a day (QID) | INTRAMUSCULAR | Status: AC | PRN
  Administered 2012-11-17: 4 mg via INTRAVENOUS

## 2012-11-17 SURGICAL SUPPLY — 56 items
BLADE CUTTER GATOR 3.5 (BLADE) ×2 IMPLANT
BLADE GREAT WHITE 4.2 (BLADE) ×2 IMPLANT
BLADE SURG 11 STRL SS (BLADE) ×2 IMPLANT
BOOTCOVER CLEANROOM LRG (PROTECTIVE WEAR) ×4 IMPLANT
BUR 3.5 LG SPHERICAL (BURR) IMPLANT
BUR OVAL 4.0 (BURR) ×2 IMPLANT
BURR 3.5 LG SPHERICAL (BURR)
CANISTER SUCT LVC 12 LTR MEDI- (MISCELLANEOUS) ×2 IMPLANT
CANNULA ACUFLEX KIT 5X76 (CANNULA) ×2 IMPLANT
CANNULA DRILOCK 5.0X75 (CANNULA) IMPLANT
CLOSURE STERI-STRIP 1/4X4 (GAUZE/BANDAGES/DRESSINGS) ×2 IMPLANT
CLOTH BEACON ORANGE TIMEOUT ST (SAFETY) ×2 IMPLANT
CONNECTOR 5 IN 1 STRAIGHT STRL (MISCELLANEOUS) ×2 IMPLANT
DRAPE INCISE 23X17 IOBAN STRL (DRAPES) ×1
DRAPE INCISE IOBAN 23X17 STRL (DRAPES) ×1 IMPLANT
DRAPE INCISE IOBAN 66X45 STRL (DRAPES) ×2 IMPLANT
DRAPE STERI 35X30 U-POUCH (DRAPES) ×2 IMPLANT
DRAPE SURG 17X11 SM STRL (DRAPES) ×2 IMPLANT
DRAPE U-SHAPE 47X51 STRL (DRAPES) ×2 IMPLANT
DRSG PAD ABDOMINAL 8X10 ST (GAUZE/BANDAGES/DRESSINGS) ×2 IMPLANT
DURAPREP 26ML APPLICATOR (WOUND CARE) ×4 IMPLANT
ELECT REM PT RETURN 9FT ADLT (ELECTROSURGICAL) ×2
ELECTRODE REM PT RTRN 9FT ADLT (ELECTROSURGICAL) ×1 IMPLANT
GLOVE BIO SURGEON STRL SZ7.5 (GLOVE) ×2 IMPLANT
GLOVE BIO SURGEON STRL SZ8 (GLOVE) ×2 IMPLANT
GLOVE EUDERMIC 7 POWDERFREE (GLOVE) ×2 IMPLANT
GLOVE SS BIOGEL STRL SZ 7.5 (GLOVE) ×1 IMPLANT
GLOVE SUPERSENSE BIOGEL SZ 7.5 (GLOVE) ×1
GOWN STRL NON-REIN LRG LVL3 (GOWN DISPOSABLE) ×2 IMPLANT
GOWN STRL REIN XL XLG (GOWN DISPOSABLE) ×4 IMPLANT
KIT BASIN OR (CUSTOM PROCEDURE TRAY) ×2 IMPLANT
KIT ROOM TURNOVER OR (KITS) ×2 IMPLANT
KIT SHOULDER TRACTION (DRAPES) ×2 IMPLANT
MANIFOLD NEPTUNE II (INSTRUMENTS) ×2 IMPLANT
NDL SUT 6 .5 CRC .975X.05 MAYO (NEEDLE) IMPLANT
NEEDLE MAYO TAPER (NEEDLE)
NEEDLE SPNL 18GX3.5 QUINCKE PK (NEEDLE) ×2 IMPLANT
NS IRRIG 1000ML POUR BTL (IV SOLUTION) ×2 IMPLANT
PACK SHOULDER (CUSTOM PROCEDURE TRAY) ×2 IMPLANT
PAD ARMBOARD 7.5X6 YLW CONV (MISCELLANEOUS) ×4 IMPLANT
SET ARTHROSCOPY TUBING (MISCELLANEOUS) ×1
SET ARTHROSCOPY TUBING LN (MISCELLANEOUS) ×1 IMPLANT
SLING ARM FOAM STRAP LRG (SOFTGOODS) IMPLANT
SLING ARM FOAM STRAP MED (SOFTGOODS) ×2 IMPLANT
SPONGE GAUZE 4X4 12PLY (GAUZE/BANDAGES/DRESSINGS) ×2 IMPLANT
SPONGE LAP 4X18 X RAY DECT (DISPOSABLE) ×2 IMPLANT
STRIP CLOSURE SKIN 1/2X4 (GAUZE/BANDAGES/DRESSINGS) ×2 IMPLANT
SUT MNCRL AB 3-0 PS2 18 (SUTURE) ×2 IMPLANT
SUT PDS AB 0 CT 36 (SUTURE) IMPLANT
SUT RETRIEVER GRASP 30 DEG (SUTURE) IMPLANT
SYR 20CC LL (SYRINGE) ×2 IMPLANT
TAPE PAPER 3X10 WHT MICROPORE (GAUZE/BANDAGES/DRESSINGS) ×2 IMPLANT
TOWEL OR 17X24 6PK STRL BLUE (TOWEL DISPOSABLE) ×2 IMPLANT
TOWEL OR 17X26 10 PK STRL BLUE (TOWEL DISPOSABLE) ×2 IMPLANT
WAND SUCTION MAX 4MM 90S (SURGICAL WAND) ×2 IMPLANT
WATER STERILE IRR 1000ML POUR (IV SOLUTION) ×2 IMPLANT

## 2012-11-17 NOTE — Anesthesia Procedure Notes (Signed)
Anesthesia Regional Block:  Interscalene brachial plexus block  Pre-Anesthetic Checklist: ,, timeout performed, Correct Patient, Correct Site, Correct Laterality, Correct Procedure, Correct Position, site marked, Risks and benefits discussed,  Surgical consent,  Pre-op evaluation,  At surgeon's request and post-op pain management  Laterality: Right  Prep: chloraprep       Needles:  Injection technique: Single-shot  Needle Type: Echogenic Stimulator Needle     Needle Length: 5cm 5 cm Needle Gauge: 22 and 22 G    Additional Needles:  Procedures: ultrasound guided (picture in chart) and nerve stimulator Interscalene brachial plexus block  Nerve Stimulator or Paresthesia:  Response: biceps flexion, 0.45 mA,   Additional Responses:   Narrative:  Start time: 11/17/2012 8:26 AM End time: 11/17/2012 8:38 AM Injection made incrementally with aspirations every 5 mL.  Performed by: Personally  Anesthesiologist: Dr Chaney Malling  Additional Notes: Functioning IV was confirmed and monitors were applied.  A 50mm 22ga Arrow echogenic stimulator needle was used. Sterile prep and drape,hand hygiene and sterile gloves were used.  Negative aspiration and negative test dose prior to incremental administration of local anesthetic. The patient tolerated the procedure well.  Ultrasound guidance: relevent anatomy identified, needle position confirmed, local anesthetic spread visualized around nerve(s), vascular puncture avoided.  Image printed for medical record.   Interscalene brachial plexus block

## 2012-11-17 NOTE — Op Note (Signed)
11/17/2012  11:10 AM  PATIENT:   Susan Ramirez  52 y.o. female  PRE-OPERATIVE DIAGNOSIS:  RIGHT SHOULDER IMPINGEMENT   POST-OPERATIVE DIAGNOSIS:  Same with ac joint oa and labral tear and partial rct  PROCEDURE:  RSA, labral debridement, rc deb, SAD, DCR  SURGEON:  Fryda Molenda, Vania Rea M.D.  ASSISTANTS: Shuford pac   ANESTHESIA:   GET + ISB  EBL: min   SPECIMEN:  none  Drains: none   PATIENT DISPOSITION:  PACU - hemodynamically stable.    PLAN OF CARE: Discharge to home after PACU  Dictation# [id number]

## 2012-11-17 NOTE — Progress Notes (Signed)
Patient states she is nauseated.  IV fluids reconnected and infusing, zofran given per Order.  Dr Rennis Chris aware.

## 2012-11-17 NOTE — Anesthesia Postprocedure Evaluation (Signed)
Anesthesia Post Note  Patient: Susan Ramirez  Procedure(s) Performed: Procedure(s) (LRB): RIGHT SHOULDER ARTHROSCOPY WITH SUBACROMIAL DECOMPRESSION DISTAL CLAVICLE RESECTION (Right)  Anesthesia type: General  Patient location: PACU  Post pain: Pain level controlled and Adequate analgesia  Post assessment: Post-op Vital signs reviewed, Patient's Cardiovascular Status Stable, Respiratory Function Stable, Patent Airway and Pain level controlled  Last Vitals:  Filed Vitals:   11/17/12 1249  BP: 114/101  Pulse: 60  Temp:   Resp: 15    Post vital signs: Reviewed and stable  Level of consciousness: awake, alert  and oriented  Complications: No apparent anesthesia complications

## 2012-11-17 NOTE — Transfer of Care (Signed)
Immediate Anesthesia Transfer of Care Note  Patient: Susan Ramirez  Procedure(s) Performed: Procedure(s): RIGHT SHOULDER ARTHROSCOPY WITH SUBACROMIAL DECOMPRESSION DISTAL CLAVICLE RESECTION (Right)  Patient Location: PACU  Anesthesia Type:General and Regional  Level of Consciousness: awake, alert  and oriented  Airway & Oxygen Therapy: Patient Spontanous Breathing  Post-op Assessment: Report given to PACU RN  Post vital signs: Reviewed and stable  Complications: No apparent anesthesia complications

## 2012-11-17 NOTE — H&P (Signed)
Rondel Jumbo    Chief Complaint: RIGHT SHOULDER IMPINGEMENT  HPI: The patient is a 52 y.o. 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 PONV (postoperative nausea and vomiting)     Past Surgical History  Procedure Laterality Date  . Eye surgery    . Shoulder arthroscopy    . Knee arthroscopy Bilateral   . Elbow surgery    . Tonsillectomy    . Appendectomy      History reviewed. No pertinent family history.  Social History:  reports that she has never smoked. She does not have any smokeless tobacco history on file. She reports that she drinks alcohol. She reports that she does not use illicit drugs.  Allergies: No Known Allergies  Medications Prior to Admission  Medication Sig Dispense Refill  . Calcium Carbonate-Vit D-Min (CALCIUM 1200 PO) Take 1 tablet by mouth daily.       Marland Kitchen ibuprofen (ADVIL,MOTRIN) 200 MG tablet Take 600 mg by mouth every 6 (six) hours as needed for mild pain or moderate pain.      . Multiple Vitamin (MULTIVITAMIN WITH MINERALS) TABS tablet Take 1 tablet by mouth daily.      . Nutritional Supplements (EQ ESTROBLEND MENOPAUSE PO) Take 1 tablet by mouth daily.          Physical Exam: right shoulder with painful and restricted motion as noted at recent office visits  Vitals  Temp:  [97.4 F (36.3 C)-98.1 F (36.7 C)] 97.4 F (36.3 C) (11/13 0722) Pulse Rate:  [60-78] 71 (11/13 0850) Resp:  [8-22] 12 (11/13 0850) BP: (109-146)/(55-92) 109/55 mmHg (11/13 0850) SpO2:  [98 %-100 %] 98 % (11/13 0850) Weight:  [81.693 kg (180 lb 1.6 oz)] 81.693 kg (180 lb 1.6 oz) (11/12 1227)  Assessment/Plan  Impression: RIGHT SHOULDER IMPINGEMENT   Plan of Action: Procedure(s): RIGHT SHOULDER ARTHROSCOPY WITH SUBACROMIAL DECOMPRESSION DISTAL CLAVICLE RESECTION  Sidnie Swalley M 11/17/2012, 8:58 AM

## 2012-11-17 NOTE — Preoperative (Signed)
Beta Blockers   Reason not to administer Beta Blockers:Not Applicable 

## 2012-11-17 NOTE — Anesthesia Preprocedure Evaluation (Signed)
Anesthesia Evaluation  Patient identified by MRN, date of birth, ID band Patient awake    Reviewed: Allergy & Precautions, H&P , NPO status , Patient's Chart, lab work & pertinent test results  History of Anesthesia Complications (+) PONV  Airway Mallampati: II  Neck ROM: full    Dental   Pulmonary neg pulmonary ROS,          Cardiovascular negative cardio ROS      Neuro/Psych Depression    GI/Hepatic   Endo/Other    Renal/GU      Musculoskeletal   Abdominal   Peds  Hematology   Anesthesia Other Findings   Reproductive/Obstetrics                           Anesthesia Physical Anesthesia Plan  ASA: II  Anesthesia Plan: General and Regional   Post-op Pain Management: MAC Combined w/ Regional for Post-op pain   Induction: Intravenous  Airway Management Planned: Oral ETT  Additional Equipment:   Intra-op Plan:   Post-operative Plan: Extubation in OR  Informed Consent: I have reviewed the patients History and Physical, chart, labs and discussed the procedure including the risks, benefits and alternatives for the proposed anesthesia with the patient or authorized representative who has indicated his/her understanding and acceptance.     Plan Discussed with: CRNA, Anesthesiologist and Surgeon  Anesthesia Plan Comments:         Anesthesia Quick Evaluation

## 2012-11-18 NOTE — Op Note (Signed)
Susan Ramirez, Susan Ramirez                 ACCOUNT NO.:  [id number]  MEDICAL RECORD NO.:  [id number]  LOCATION:  [facility]                         FACILITY:  [facility]  PHYSICIAN:  Vania Rea. Gianna Calef, M.D.  DATE OF BIRTH:  [birth year]  DATE OF PROCEDURE:  11/17/2012 DATE OF DISCHARGE:  11/17/2012                              OPERATIVE REPORT   PREOPERATIVE DIAGNOSES: 1. Chronic right shoulder impingement syndrome. 2. 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  POSTOPERATIVE DIAGNOSES: 1. Chronic right shoulder impingement syndrome. 2. 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 3. Degenerative type 1 superior labral tear. 4. Partial articular rotator cuff tear  PROCEDURE: 1. Right shoulder examination under anesthesia. 2. Right shoulder acromioclavicular joint diagnostic arthroscopy. 3. Debridement of partial articular rotator cuff tear. 4. Debridement of degenerative labral tear. 5. Arthroscopic subacromial decompression bursectomy. 6. Arthroscopic distal clavicle resection.  SURGEON:  Vania Rea. Jessamy Torosyan, M.D.  Threasa HeadsFrench Ana A. Shuford, PA-C.  ANESTHESIA:  General endotracheal as well as an interscalene block.  ESTIMATED BLOOD LOSS:  Minimal.  DRAINS:  None.  HISTORY:  Susan Ramirez is a 52 year old female who has had chronic and progressive increasing right shoulder pain with impingement syndrome and symptoms that they have been refractory to prolonged attempts at conservative management.  Due to her ongoing pain and functional limitations, she was brought to the operating room at this time for planned right shoulder arthroscopy as described below.  Preoperatively, I counseled Susan Ramirez on treatment options as well as risks versus benefits thereof.  Possible surgical complications were reviewed including the potential for bleeding, infection, neurovascular injury, persistent pain, loss of motion, anesthetic complication, and possible need for  additional surgery.  She understands and accepts and agrees with our planned procedure.  PROCEDURE IN DETAIL:  After undergoing routine preop evaluation, the patient received prophylactic antibiotics and an interscalene block was established in the holding area by the Anesthesia Department.  She was placed supine on the operating table, underwent smooth induction of general endotracheal anesthesia.  She was turned to the left lateral decubitus position on a beanbag and appropriately padded and protected. The right shoulder examination under anesthesia revealed full motion. No instability patterns were noted.  Right arm was then suspended at 70 degrees of abduction with 10 pounds of traction.  Right shoulder girdle region was sterilely prepped and draped in standard fashion.  Time-out was called.  A posterior portal was established into the glenohumeral joint.  Anterior portal was established under direct visualization.  The glenohumeral articular surfaces were all in excellent condition.  No instability patterns noted.  There was a very small degenerative tear of the superior labrum was debrided with a shaver.  There was a partial articular rotator cuff tear involving the distal supraspinatus and the upper subscap and these areas were debrided with a shaver.  But I would estimate a count for less than 10% of the thickness of the tendon. Biceps tendon was normal caliber.  No instability proximally or distally.  At this point, fluid and instruments were removed from the glenohumeral joint.  The arm was dropped down to 30 degrees of abduction with the arthroscope into the subacromial space of the posterior  portal and a direct lateral portal was established in the subacromial space.  Abundant dense bursal tissue and multiple adhesions were encountered and these were all divided and excised with the combination of shaver and a Stryker wand.  The wand was then used to remove the periosteum  from the undersurface of the anterior half of the acromion.  The subacromial decompression was performed with a bur creating a type 1 morphology. Portal was then established directly anterior to the distal clavicle and distal clavicle resection was performed with a bur.  Care was taken to confirm visualization of the entire circumference of the distal clavicle to ensure adequate removal of bone.  We then completed the subacromial/subdeltoid bursectomy.  The bursal surface of the rotator cuff was carefully inspected, found to be intact.  Final hemostasis was obtained.  Fluid and instruments was removed.  The portals were closed with Monocryl and Steri-Strips.  Dry dressing taped at the right shoulder.  Right arm placed in a sling.  The patient was awakened, extubated, and taken to the recovery room in stable condition.     Vania Rea. Rosealie Reach, M.D.     KMS/MEDQ  D:  11/17/2012  T:  11/18/2012  Job:  [id number]

## 2012-11-21 ENCOUNTER — Encounter (HOSPITAL_COMMUNITY): Payer: Self-pay | Admitting: Orthopedic Surgery

## 2013-02-28 ENCOUNTER — Other Ambulatory Visit: Payer: Self-pay | Admitting: Dermatology

## 2013-09-14 ENCOUNTER — Other Ambulatory Visit: Payer: Self-pay | Admitting: Dermatology

## 2013-10-10 ENCOUNTER — Other Ambulatory Visit: Payer: Self-pay | Admitting: Obstetrics and Gynecology

## 2013-10-12 LAB — CYTOLOGY - PAP

## 2014-03-15 ENCOUNTER — Other Ambulatory Visit: Payer: Self-pay | Admitting: Dermatology

## 2014-04-04 ENCOUNTER — Encounter: Payer: Self-pay | Admitting: Internal Medicine

## 2014-06-11 ENCOUNTER — Encounter: Payer: Self-pay | Admitting: Internal Medicine

## 2014-08-02 ENCOUNTER — Ambulatory Visit (AMBULATORY_SURGERY_CENTER): Payer: Self-pay

## 2014-08-02 VITALS — Ht 65.5 in | Wt 161.0 lb

## 2014-08-02 DIAGNOSIS — Z86010 Personal history of colonic polyps: Secondary | ICD-10-CM

## 2014-08-02 MED ORDER — NA SULFATE-K SULFATE-MG SULF 17.5-3.13-1.6 GM/177ML PO SOLN: 1.0000 | Freq: Once | ORAL | Status: DC

## 2014-08-02 NOTE — Progress Notes (Signed)
Pt doesn't take diet or weight loss medications Not on home 02 No egg or soy allergies Hx of mild post op Nausea/vomitting

## 2014-08-23 ENCOUNTER — Ambulatory Visit (AMBULATORY_SURGERY_CENTER): Payer: Commercial Managed Care - HMO | Admitting: Internal Medicine

## 2014-08-23 ENCOUNTER — Encounter: Payer: Self-pay | Admitting: Internal Medicine

## 2014-08-23 VITALS — BP 123/80 | HR 51 | Temp 97.0°F | Resp 15 | Ht 65.0 in | Wt 161.0 lb

## 2014-08-23 DIAGNOSIS — D122 Benign neoplasm of ascending colon: Secondary | ICD-10-CM

## 2014-08-23 DIAGNOSIS — Z86010 Personal history of colonic polyps: Secondary | ICD-10-CM

## 2014-08-23 HISTORY — PX: COLONOSCOPY: SHX174

## 2014-08-23 MED ORDER — SODIUM CHLORIDE 0.9 % IV SOLN: 500.0000 mL | INTRAVENOUS | Status: DC

## 2014-08-23 NOTE — Patient Instructions (Signed)
YOU HAD AN ENDOSCOPIC PROCEDURE TODAY AT [facility]:   Refer to the procedure report that was given to you for any specific questions about what was found during the examination.  If the procedure report does not answer your questions, please call your gastroenterologist to clarify.  If you requested that your care partner not be given the details of your procedure findings, then the procedure report has been included in a sealed envelope for you to review at your convenience later.  YOU SHOULD EXPECT: Some feelings of bloating in the abdomen. Passage of more gas than usual.  Walking can help get rid of the air that was put into your GI tract during the procedure and reduce the bloating. If you had a lower endoscopy (such as a colonoscopy or flexible sigmoidoscopy) you may notice spotting of blood in your stool or on the toilet paper. If you underwent a bowel prep for your procedure, you may not have a normal bowel movement for a few days.  Please Note:  You might notice some irritation and congestion in your nose or some drainage.  This is from the oxygen used during your procedure.  There is no need for concern and it should clear up in a day or so.  SYMPTOMS TO REPORT IMMEDIATELY:   Following lower endoscopy (colonoscopy or flexible sigmoidoscopy):  Excessive amounts of blood in the stool  Significant tenderness or worsening of abdominal pains  Swelling of the abdomen that is new, acute  Fever of 100F or higher   For urgent or emergent issues, a gastroenterologist can be reached at any hour by calling [phone].   DIET: Your first meal following the procedure should be a small meal and then it is ok to progress to your normal diet. Heavy or fried foods are harder to digest and may make you feel nauseous or bloated.  Likewise, meals heavy in dairy and vegetables can increase bloating.  Drink plenty of fluids but you should avoid alcoholic beverages for 24  hours.  ACTIVITY:  You should plan to take it easy for the rest of today and you should NOT DRIVE or use heavy machinery until tomorrow (because of the sedation medicines used during the test).    FOLLOW UP: Our staff will call the number listed on your records the next business day following your procedure to check on you and address any questions or concerns that you may have regarding the information given to you following your procedure. If we do not reach you, we will leave a message.  However, if you are feeling well and you are not experiencing any problems, there is no need to return our call.  We will assume that you have returned to your regular daily activities without incident.  If any biopsies were taken you will be contacted by phone or by letter within the next 1-3 weeks.  Please call us at [phone] if you have not heard about the biopsies in 3 weeks.    SIGNATURES/CONFIDENTIALITY: You and/or your care partner have signed paperwork which will be entered into your electronic medical record.  These signatures attest to the fact that that the information above on your After Visit Summary has been reviewed and is understood.  Full responsibility of the confidentiality of this discharge information lies with you and/or your care Partner.  Await pathology report Repeat Colonoscopy in 5 years

## 2014-08-23 NOTE — Progress Notes (Signed)
Transferred to recovery room. A/O x3, pleased with MAC.  VSS.  Report to Jill, RN. 

## 2014-08-23 NOTE — Op Note (Signed)
[place]  [address]. [place], [address]   COLONOSCOPY PROCEDURE REPORT  PATIENT: Susan Ramirez, Susan Ramirez  MR#: [id number] BIRTHDATE: [birth year] , 53  yrs. old GENDER: female ENDOSCOPIST: Jerene Bears, MD REFERRED HQ:[facility] R Patterson, M.D. PROCEDURE DATE:  08/23/2014 PROCEDURE:   Colonoscopy, surveillance and Colonoscopy with snare polypectomy First Screening Colonoscopy - Avg.  risk and is 50 yrs.  old or older - No.  Prior Negative Screening - Now for repeat screening. N/A  History of Adenoma - Now for follow-up colonoscopy & has been > or = to 3 yrs.  Yes hx of adenoma.  Has been 3 or more years since last colonoscopy.  Polyps removed today? Yes ASA CLASS:   Class II INDICATIONS:Surveillance due to prior colonic neoplasia and PH Colon Adenoma (tubulovillous adenoma 2011, Dr. Sharlett Iles). MEDICATIONS: Monitored anesthesia care and Propofol 250 mg IV  DESCRIPTION OF PROCEDURE:   After the risks benefits and alternatives of the procedure were thoroughly explained, informed consent was obtained.  The digital rectal exam revealed no rectal mass.   The LB 1528  endoscope was introduced through the anus and advanced to the cecum, which was identified by both the appendix and ileocecal valve. No adverse events experienced.   The quality of the prep was excellent.  (Suprep was used)  The instrument was then slowly withdrawn as the colon was fully examined. Estimated blood loss is zero unless otherwise noted in this procedure report.    COLON FINDINGS: A sessile polyp measuring 4 mm in size was found in the ascending colon.  A polypectomy was performed with a cold snare.  The resection was complete, the polyp tissue was completely retrieved and sent to histology.   The examination was otherwise normal.  Retroflexed views revealed hypertrophied anal papilla. The time to cecum = 5.2 Withdrawal time = 10.9   The scope was withdrawn and the procedure  completed. COMPLICATIONS: There were no immediate complications.  ENDOSCOPIC IMPRESSION: 1.   Sessile polyp was found in the ascending colon; polypectomy was performed with a cold snare 2.   The examination was otherwise normal  RECOMMENDATIONS: 1.  Await pathology results 2.  Repeat Colonoscopy in 5 years. 3.  You will receive a letter within 1-2 weeks with the results of your biopsy as well as final recommendations.  Please call my office if you have not received a letter after 3 weeks.  eSigned:  Jerene Bears, MD 08/23/2014 9:34 AM   cc: The Patient and Jonathon Jordan, MD

## 2014-08-23 NOTE — Progress Notes (Signed)
Called to room to assist during endoscopic procedure.  Patient ID and intended procedure confirmed with present staff. Received instructions for my participation in the procedure from the performing physician.  

## 2014-08-24 ENCOUNTER — Telehealth: Payer: Self-pay

## 2014-08-24 NOTE — Telephone Encounter (Signed)
Left message on answering machine. 

## 2014-08-28 ENCOUNTER — Encounter: Payer: Self-pay | Admitting: Internal Medicine

## 2015-03-18 ENCOUNTER — Encounter: Payer: Self-pay | Admitting: Gastroenterology

## 2016-05-11 ENCOUNTER — Other Ambulatory Visit: Payer: Self-pay | Admitting: Family Medicine

## 2016-05-11 ENCOUNTER — Ambulatory Visit
Admission: RE | Admit: 2016-05-11 | Discharge: 2016-05-11 | Disposition: A | Discharge status: Home / Self Care | Payer: Commercial Managed Care - HMO | Source: Ambulatory Visit | Attending: Family Medicine | Admitting: Family Medicine

## 2016-05-11 DIAGNOSIS — R05 Cough: Secondary | ICD-10-CM

## 2016-05-11 DIAGNOSIS — R059 Cough, unspecified: Secondary | ICD-10-CM

## 2016-08-10 DIAGNOSIS — R053 Chronic cough: Secondary | ICD-10-CM | POA: Insufficient documentation

## 2016-08-10 DIAGNOSIS — R05 Cough: Secondary | ICD-10-CM | POA: Insufficient documentation

## 2017-04-06 DIAGNOSIS — Z86010 Personal history of colonic polyps: Secondary | ICD-10-CM | POA: Insufficient documentation

## 2017-10-22 ENCOUNTER — Encounter: Payer: Self-pay | Admitting: Podiatry

## 2017-10-22 ENCOUNTER — Ambulatory Visit: Payer: 59 | Admitting: Podiatry

## 2017-10-22 ENCOUNTER — Other Ambulatory Visit: Payer: Self-pay | Admitting: Podiatry

## 2017-10-22 ENCOUNTER — Ambulatory Visit (INDEPENDENT_AMBULATORY_CARE_PROVIDER_SITE_OTHER): Payer: 59

## 2017-10-22 VITALS — BP 131/82 | HR 69

## 2017-10-22 DIAGNOSIS — M79672 Pain in left foot: Secondary | ICD-10-CM

## 2017-10-22 DIAGNOSIS — M779 Enthesopathy, unspecified: Secondary | ICD-10-CM

## 2017-10-22 DIAGNOSIS — M79671 Pain in right foot: Secondary | ICD-10-CM | POA: Diagnosis not present

## 2017-10-22 MED ORDER — DICLOFENAC SODIUM 75 MG PO TBEC: 75.0000 mg | DELAYED_RELEASE_TABLET | Freq: Two times a day (BID) | ORAL | 2 refills | Status: DC

## 2017-10-22 MED ORDER — TRIAMCINOLONE ACETONIDE 10 MG/ML IJ SUSP
10.0000 mg | Freq: Once | INTRAMUSCULAR | Status: AC
  Administered 2017-10-22: 10 mg

## 2017-10-22 NOTE — Progress Notes (Signed)
Subjective:   Patient ID: Susan Ramirez, female   DOB: 57 y.o.   MRN: [id number]   HPI Patient presents with pain on the balls of both feet stating that the fourth metatarsal hurts the most and that is been going on now for around a year.  Left foot hurts more than the right and she does not remember any specific injury that occurred and she states it hurt worse when she is on hardwood floors and barefoot.  Patient does not smoke and likes to be active   Review of Systems  All other systems reviewed and are negative.       Objective:  Physical Exam  Constitutional: She appears well-developed and well-nourished.  Cardiovascular: Intact distal pulses.  Pulmonary/Chest: Effort normal.  Musculoskeletal: Normal range of motion.  Neurological: She is alert.  Skin: Skin is warm.  Nursing note and vitals reviewed.   Neurovascular status intact muscle strength adequate range of motion within normal limits with patient found to have exquisite discomfort fourth metatarsal phalangeal joint left over right with inflammation fluid and mild to moderate discomfort second third metatarsal phalangeal joints bilateral.  Patient is found to have good digital perfusion well oriented x3     Assessment:  Possibility for acute capsulitis versus possibility for low-grade metatarsalgia bilateral left over right foot     Plan:  H&P conditions reviewed and discussed with patient.  Today with a focus on the left foot and I did a proximal nerve block I then did a forefoot prep of the area aspirated the fourth MPJ getting out a small amount of clear fluid and injected quarter cc dexamethasone Kenalog and applied thick pad to reduce pressure on the joint surface.  Placed on oral anti-inflammatory Mobic 15 mg daily and reappoint in 3 weeks  X-ray indicates that there is no signs of stress fracture or arthritic condition

## 2017-11-05 ENCOUNTER — Ambulatory Visit: Payer: 59 | Admitting: Podiatry

## 2017-11-05 ENCOUNTER — Encounter: Payer: Self-pay | Admitting: Podiatry

## 2017-11-05 DIAGNOSIS — M779 Enthesopathy, unspecified: Secondary | ICD-10-CM | POA: Diagnosis not present

## 2017-11-08 NOTE — Progress Notes (Signed)
Subjective:   Patient ID: Susan Ramirez, female   DOB: 57 y.o.   MRN: [id number]   HPI Patient presents stating that her feet are feeling better but she still gets some pain in the joint and she still feels like the bone is low and it both of them that seem to do this   ROS      Objective:  Physical Exam  Neurovascular status intact with moderate plantar flexion of the fourth metatarsal bilateral with inflammation around the head of the bone left over right still present improved from previous visit     Assessment:  Inflammatory capsulitis fourth MPJ bilateral with moderate plantarflexion of the joint     Plan:  Reviewed condition and recommended orthotics to try to take stress off the capsule and reduce the inflammation is present.  Patient is scanned by ped orthotist for orthotics will be seen back and will continue with rigid bottom shoes until that time

## 2017-11-29 ENCOUNTER — Ambulatory Visit: Payer: 59 | Admitting: Orthotics

## 2017-11-29 DIAGNOSIS — M779 Enthesopathy, unspecified: Secondary | ICD-10-CM

## 2017-11-29 NOTE — Progress Notes (Signed)
Patient came in today to pick up custom made foot orthotics.  The goals were accomplished and the patient reported no dissatisfaction with said orthotics.  Patient was advised of breakin period and how to report any issues. 

## 2017-12-13 ENCOUNTER — Other Ambulatory Visit: Payer: 59 | Admitting: Orthotics

## 2017-12-16 ENCOUNTER — Ambulatory Visit (INDEPENDENT_AMBULATORY_CARE_PROVIDER_SITE_OTHER): Payer: 59 | Admitting: Orthotics

## 2017-12-16 DIAGNOSIS — M79672 Pain in left foot: Secondary | ICD-10-CM

## 2017-12-16 DIAGNOSIS — M79671 Pain in right foot: Secondary | ICD-10-CM

## 2017-12-16 DIAGNOSIS — M779 Enthesopathy, unspecified: Secondary | ICD-10-CM

## 2017-12-16 NOTE — Progress Notes (Signed)
Patient came in today to pick up custom made foot orthotics.  The goals were accomplished and the patient reported no dissatisfaction with said orthotics.  Patient was advised of breakin period and how to report any issues. 

## 2018-01-07 ENCOUNTER — Other Ambulatory Visit: Payer: Self-pay | Admitting: Podiatry

## 2018-01-07 MED ORDER — DICLOFENAC SODIUM 75 MG PO TBEC: 75.0000 mg | DELAYED_RELEASE_TABLET | Freq: Two times a day (BID) | ORAL | 2 refills | Status: DC

## 2018-03-31 ENCOUNTER — Other Ambulatory Visit: Payer: Self-pay | Admitting: Podiatry

## 2018-06-11 ENCOUNTER — Other Ambulatory Visit: Payer: Self-pay | Admitting: Podiatry

## 2018-08-14 ENCOUNTER — Other Ambulatory Visit: Payer: Self-pay | Admitting: Podiatry

## 2018-09-16 DIAGNOSIS — B001 Herpesviral vesicular dermatitis: Secondary | ICD-10-CM | POA: Insufficient documentation

## 2019-07-20 DIAGNOSIS — M545 Low back pain, unspecified: Secondary | ICD-10-CM | POA: Insufficient documentation

## 2019-12-12 ENCOUNTER — Encounter: Payer: Self-pay | Admitting: Internal Medicine

## 2019-12-22 ENCOUNTER — Encounter: Payer: Self-pay | Admitting: Medical-Surgical

## 2019-12-22 ENCOUNTER — Other Ambulatory Visit: Payer: Self-pay

## 2019-12-22 ENCOUNTER — Ambulatory Visit (INDEPENDENT_AMBULATORY_CARE_PROVIDER_SITE_OTHER): Payer: 59 | Admitting: Medical-Surgical

## 2019-12-22 VITALS — BP 142/87 | HR 72 | Temp 98.2°F | Ht 66.0 in | Wt 183.4 lb

## 2019-12-22 DIAGNOSIS — Z1329 Encounter for screening for other suspected endocrine disorder: Secondary | ICD-10-CM | POA: Diagnosis not present

## 2019-12-22 DIAGNOSIS — Z114 Encounter for screening for human immunodeficiency virus [HIV]: Secondary | ICD-10-CM | POA: Diagnosis not present

## 2019-12-22 DIAGNOSIS — Z0000 Encounter for general adult medical examination without abnormal findings: Secondary | ICD-10-CM

## 2019-12-22 DIAGNOSIS — Z7689 Persons encountering health services in other specified circumstances: Secondary | ICD-10-CM

## 2019-12-22 DIAGNOSIS — Z23 Encounter for immunization: Secondary | ICD-10-CM

## 2019-12-22 DIAGNOSIS — Z1159 Encounter for screening for other viral diseases: Secondary | ICD-10-CM

## 2019-12-22 DIAGNOSIS — M545 Low back pain, unspecified: Secondary | ICD-10-CM

## 2019-12-22 DIAGNOSIS — G8929 Other chronic pain: Secondary | ICD-10-CM

## 2019-12-22 MED ORDER — MELOXICAM 15 MG PO TABS: 15.0000 mg | ORAL_TABLET | Freq: Every day | ORAL | 0 refills | Status: DC

## 2019-12-22 NOTE — Patient Instructions (Addendum)
Preventive Care 40-60 Years Old, Female Preventive care refers to visits with your health care provider and lifestyle choices that can promote health and wellness. This includes:  A yearly physical exam. This may also be called an annual well check.  Regular dental visits and eye exams.  Immunizations.  Screening for certain conditions.  Healthy lifestyle choices, such as eating a healthy diet, getting regular exercise, not using drugs or products that contain nicotine and tobacco, and limiting alcohol use. What can I expect for my preventive care visit? Physical exam Your health care provider will check your:  Height and weight. This may be used to calculate body mass index (BMI), which tells if you are at a healthy weight.  Heart rate and blood pressure.  Skin for abnormal spots. Counseling Your health care provider may ask you questions about your:  Alcohol, tobacco, and drug use.  Emotional well-being.  Home and relationship well-being.  Sexual activity.  Eating habits.  Work and work environment.  Method of birth control.  Menstrual cycle.  Pregnancy history. What immunizations do I need?  Influenza (flu) vaccine  This is recommended every year. Tetanus, diphtheria, and pertussis (Tdap) vaccine  You may need a Td booster every 10 years. Varicella (chickenpox) vaccine  You may need this if you have not been vaccinated. Zoster (shingles) vaccine  You may need this after age 60. Measles, mumps, and rubella (MMR) vaccine  You may need at least one dose of MMR if you were born in 1957 or later. You may also need a second dose. Pneumococcal conjugate (PCV13) vaccine  You may need this if you have certain conditions and were not previously vaccinated. Pneumococcal polysaccharide (PPSV23) vaccine  You may need one or two doses if you smoke cigarettes or if you have certain conditions. Meningococcal conjugate (MenACWY) vaccine  You may need this if you  have certain conditions. Hepatitis A vaccine  You may need this if you have certain conditions or if you travel or work in places where you may be exposed to hepatitis A. Hepatitis B vaccine  You may need this if you have certain conditions or if you travel or work in places where you may be exposed to hepatitis B. Haemophilus influenzae type b (Hib) vaccine  You may need this if you have certain conditions. Human papillomavirus (HPV) vaccine  If recommended by your health care provider, you may need three doses over 6 months. You may receive vaccines as individual doses or as more than one vaccine together in one shot (combination vaccines). Talk with your health care provider about the risks and benefits of combination vaccines. What tests do I need? Blood tests  Lipid and cholesterol levels. These may be checked every 5 years, or more frequently if you are over 60 years old.  Hepatitis C test.  Hepatitis B test. Screening  Lung cancer screening. You may have this screening every year starting at age 60 if you have a 30-pack-year history of smoking and currently smoke or have quit within the past 15 years.  Colorectal cancer screening. All adults should have this screening starting at age 60 and continuing until age 75. Your health care provider may recommend screening at age 60 if you are at increased risk. You will have tests every 1-10 years, depending on your results and the type of screening test.  Diabetes screening. This is done by checking your blood sugar (glucose) after you have not eaten for a while (fasting). You may have this   done every 1-3 years.  Mammogram. This may be done every 1-2 years. Talk with your health care provider about when you should start having regular mammograms. This may depend on whether you have a family history of breast cancer.  BRCA-related cancer screening. This may be done if you have a family history of breast, ovarian, tubal, or peritoneal  cancers.  Pelvic exam and Pap test. This may be done every 3 years starting at age 60. Starting at age 60, this may be done every 5 years if you have a Pap test in combination with an HPV test. Other tests  Sexually transmitted disease (STD) testing.  Bone density scan. This is done to screen for osteoporosis. You may have this scan if you are at high risk for osteoporosis. Follow these instructions at home: Eating and drinking  Eat a diet that includes fresh fruits and vegetables, whole grains, lean protein, and low-fat dairy.  Take vitamin and mineral supplements as recommended by your health care provider.  Do not drink alcohol if: ? Your health care provider tells you not to drink. ? You are pregnant, may be pregnant, or are planning to become pregnant.  If you drink alcohol: ? Limit how much you have to 0-1 drink a day. ? Be aware of how much alcohol is in your drink. In the [place] one drink equals one 12 oz bottle of beer (355 mL), one 5 oz glass of wine (148 mL), or one 1 oz glass of hard liquor (44 mL). Lifestyle  Take daily care of your teeth and gums.  Stay active. Exercise for at least 30 minutes on 5 or more days each week.  Do not use any products that contain nicotine or tobacco, such as cigarettes, e-cigarettes, and chewing tobacco. If you need help quitting, ask your health care provider.  If you are sexually active, practice safe sex. Use a condom or other form of birth control (contraception) in order to prevent pregnancy and STIs (sexually transmitted infections).  If told by your health care provider, take low-dose aspirin daily starting at age 60. What's next?  Visit your health care provider once a year for a well check visit.  Ask your health care provider how often you should have your eyes and teeth checked.  Stay up to date on all vaccines. This information is not intended to replace advice given to you by your health care provider. Make sure you  discuss any questions you have with your health care provider. Document Revised: 09/02/2017 Document Reviewed: 09/02/2017 Elsevier Patient Education  [place].    Influenza (Flu) Vaccine (Inactivated or Recombinant): What You Need to Know 1. Why get vaccinated? Influenza vaccine can prevent influenza (flu). Flu is a contagious disease that spreads around the [place] every year, usually between October and May. Anyone can get the flu, but it is more dangerous for some people. Infants and young children, people 53 years of age and older, pregnant women, and people with certain health conditions or a weakened immune system are at greatest risk of flu complications. Pneumonia, bronchitis, sinus infections and ear infections are examples of flu-related complications. If you have a medical condition, such as heart disease, cancer or diabetes, flu can make it worse. Flu can cause fever and chills, sore throat, muscle aches, fatigue, cough, headache, and runny or stuffy nose. Some people may have vomiting and diarrhea, though this is more common in children than adults. Each year thousands of people in the [place]  States die from flu, and many more are hospitalized. Flu vaccine prevents millions of illnesses and flu-related visits to the doctor each year. 2. Influenza vaccine CDC recommends everyone 72 months of age and older get vaccinated every flu season. Children 6 months through 33 years of age may need 2 doses during a single flu season. Everyone else needs only 1 dose each flu season. It takes about 2 weeks for protection to develop after vaccination. There are many flu viruses, and they are always changing. Each year a new flu vaccine is made to protect against three or four viruses that are likely to cause disease in the upcoming flu season. Even when the vaccine doesn't exactly match these viruses, it may still provide some protection. Influenza vaccine does not cause flu. Influenza  vaccine may be given at the same time as other vaccines. 3. Talk with your health care provider Tell your vaccine provider if the person getting the vaccine:  Has had an allergic reaction after a previous dose of influenza vaccine, or has any severe, life-threatening allergies.  Has ever had Guillain-Barr Syndrome (also called GBS). In some cases, your health care provider may decide to postpone influenza vaccination to a future visit. People with minor illnesses, such as a cold, may be vaccinated. People who are moderately or severely ill should usually wait until they recover before getting influenza vaccine. Your health care provider can give you more information. 4. Risks of a vaccine reaction  Soreness, redness, and swelling where shot is given, fever, muscle aches, and headache can happen after influenza vaccine.  There may be a very small increased risk of Guillain-Barr Syndrome (GBS) after inactivated influenza vaccine (the flu shot). Young children who get the flu shot along with pneumococcal vaccine (PCV13), and/or DTaP vaccine at the same time might be slightly more likely to have a seizure caused by fever. Tell your health care provider if a child who is getting flu vaccine has ever had a seizure. People sometimes faint after medical procedures, including vaccination. Tell your provider if you feel dizzy or have vision changes or ringing in the ears. As with any medicine, there is a very remote chance of a vaccine causing a severe allergic reaction, other serious injury, or death. 5. What if there is a serious problem? An allergic reaction could occur after the vaccinated person leaves the clinic. If you see signs of a severe allergic reaction (hives, swelling of the face and throat, difficulty breathing, a fast heartbeat, dizziness, or weakness), call 9-1-1 and get the person to the nearest hospital. For other signs that concern you, call your health care provider. Adverse  reactions should be reported to the Vaccine Adverse Event Reporting System (VAERS). Your health care provider will usually file this report, or you can do it yourself. Visit the VAERS website at www.vaers.SamedayNews.es or call 903-178-4062.VAERS is only for reporting reactions, and VAERS staff do not give medical advice. 6. The National Vaccine Injury Compensation Program The [employer] Vaccine Injury Compensation Program (VICP) is a federal program that was created to compensate people who may have been injured by certain vaccines. Visit the VICP website at GoldCloset.com.ee or call 706-736-1832 to learn about the program and about filing a claim. There is a time limit to file a claim for compensation. 7. How can I learn more?  Ask your healthcare provider.  Call your local or state health department.  Contact the Centers for Disease Control and Prevention (CDC): ? Call 206-265-4538 (1-800-CDC-INFO) or ?  Visit CDC's https://gibson.com/ Vaccine Information Statement (Interim) Inactivated Influenza Vaccine (08/19/2017) This information is not intended to replace advice given to you by your health care provider. Make sure you discuss any questions you have with your health care provider. Document Revised: 04/12/2018 Document Reviewed: 08/23/2017 Elsevier Patient Education  [place].   https://www.cdc.gov/vaccines/hcp/vis/vis-statements/tdap.pdf">  Tdap (Tetanus, Diphtheria, Pertussis) Vaccine: What You Need to Know 1. Why get vaccinated? Tdap vaccine can prevent tetanus, diphtheria, and pertussis. Diphtheria and pertussis spread from person to person. Tetanus enters the body through cuts or wounds.  TETANUS (T) causes painful stiffening of the muscles. Tetanus can lead to serious health problems, including being unable to open the mouth, having trouble swallowing and breathing, or death.  DIPHTHERIA (D) can lead to difficulty breathing, heart failure, paralysis, or  death.  PERTUSSIS (aP), also known as "whooping cough," can cause uncontrollable, violent coughing which makes it hard to breathe, eat, or drink. Pertussis can be extremely serious in babies and young children, causing pneumonia, convulsions, brain damage, or death. In teens and adults, it can cause weight loss, loss of bladder control, passing out, and rib fractures from severe coughing. 2. Tdap vaccine Tdap is only for children 7 years and older, adolescents, and adults.  Adolescents should receive a single dose of Tdap, preferably at age 60 or 36 years. Pregnant women should get a dose of Tdap during every pregnancy, to protect the newborn from pertussis. Infants are most at risk for severe, life-threatening complications from pertussis. Adults who have never received Tdap should get a dose of Tdap. Also, adults should receive a booster dose every 10 years, or earlier in the case of a severe and dirty wound or burn. Booster doses can be either Tdap or Td (a different vaccine that protects against tetanus and diphtheria but not pertussis). Tdap may be given at the same time as other vaccines. 3. Talk with your health care provider Tell your vaccine provider if the person getting the vaccine:  Has had an allergic reaction after a previous dose of any vaccine that protects against tetanus, diphtheria, or pertussis, or has any severe, life-threatening allergies.  Has had a coma, decreased level of consciousness, or prolonged seizures within 7 days after a previous dose of any pertussis vaccine (DTP, DTaP, or Tdap).  Has seizures or another nervous system problem.  Has ever had Guillain-Barr Syndrome (also called GBS).  Has had severe pain or swelling after a previous dose of any vaccine that protects against tetanus or diphtheria. In some cases, your health care provider may decide to postpone Tdap vaccination to a future visit.  People with minor illnesses, such as a cold, may be vaccinated.  People who are moderately or severely ill should usually wait until they recover before getting Tdap vaccine.  Your health care provider can give you more information. 4. Risks of a vaccine reaction  Pain, redness, or swelling where the shot was given, mild fever, headache, feeling tired, and nausea, vomiting, diarrhea, or stomachache sometimes happen after Tdap vaccine. People sometimes faint after medical procedures, including vaccination. Tell your provider if you feel dizzy or have vision changes or ringing in the ears.  As with any medicine, there is a very remote chance of a vaccine causing a severe allergic reaction, other serious injury, or death. 5. What if there is a serious problem? An allergic reaction could occur after the vaccinated person leaves the clinic. If you see signs of a severe allergic reaction (hives, swelling of  the face and throat, difficulty breathing, a fast heartbeat, dizziness, or weakness), call 9-1-1 and get the person to the nearest hospital. For other signs that concern you, call your health care provider.  Adverse reactions should be reported to the Vaccine Adverse Event Reporting System (VAERS). Your health care provider will usually file this report, or you can do it yourself. Visit the VAERS website at www.vaers.SamedayNews.es or call 419-324-3239. VAERS is only for reporting reactions, and VAERS staff do not give medical advice. 6. The National Vaccine Injury Compensation Program The [employer] Vaccine Injury Compensation Program (VICP) is a federal program that was created to compensate people who may have been injured by certain vaccines. Visit the VICP website at GoldCloset.com.ee or call 614-544-3132 to learn about the program and about filing a claim. There is a time limit to file a claim for compensation. 7. How can I learn more?  Ask your health care provider.  Call your local or state health department.  Contact the Centers for Disease  Control and Prevention (CDC): ? Call 9784404133 (1-800-CDC-INFO) or ? Visit CDC's website at http://hunter.com/ Vaccine Information Statement Tdap (Tetanus, Diphtheria, Pertussis) Vaccine (04/06/2018) This information is not intended to replace advice given to you by your health care provider. Make sure you discuss any questions you have with your health care provider. Document Revised: 04/15/2018 Document Reviewed: 04/18/2018 Elsevier Patient Education  [place].

## 2019-12-22 NOTE — Progress Notes (Signed)
New Patient Office Visit  Subjective:  Patient ID: Susan Ramirez, female    DOB: Apr 03, 1960  Age: 59 y.o. MRN: [id number]  CC:  Chief Complaint  Patient presents with  . Establish Care    HPI Susan Ramirez presents to establish care.   Dentist- UTD Eye exam- UTD Dermatology- every 6 months  Has been experiencing hair loss recently and wanted to have thyroid checked.   Low back pain for the majority of this year. No sciatica. Has been evaluated by ortho with no cause of pain identified. Considering going to a chiropractor. Taking 800mg  ibuprofen about 2 times daily.    Past Medical History:  Diagnosis Date  . Heart murmur    Only during childhood  . PONV (postoperative nausea and vomiting)   . Postmenopausal hormone replacement therapy     Past Surgical History:  Procedure Laterality Date  . APPENDECTOMY    . ELBOW SURGERY    . EYE SURGERY    . KNEE ARTHROSCOPY Bilateral   . SHOULDER ARTHROSCOPY    . SHOULDER ARTHROSCOPY WITH DISTAL CLAVICLE RESECTION Right 11/17/2012   Procedure: RIGHT SHOULDER ARTHROSCOPY WITH SUBACROMIAL DECOMPRESSION DISTAL CLAVICLE RESECTION;  Surgeon: Marin Shutter, MD;  Location: [facility];  Service: Orthopedics;  Laterality: Right;  . TONSILLECTOMY      Family History  Problem Relation Age of Onset  . Skin cancer Mother   . Hypertension Mother   . Thyroid disease Mother   . Heart attack Father   . Diabetes Father   . Colon cancer Neg Hx     Social History   Socioeconomic History  . Marital status: Married    Spouse name: Not on file  . Number of children: Not on file  . Years of education: Not on file  . Highest education level: Not on file  Occupational History  . Not on file  Tobacco Use  . Smoking status: Never Smoker  . Smokeless tobacco: Never Used  Vaping Use  . Vaping Use: Never used  Substance and Sexual Activity  . Alcohol use: Yes    Alcohol/week: 1.0 standard drink    Types: 1 Standard drinks or equivalent per week   . Drug use: Never  . Sexual activity: Yes    Partners: Male    Birth control/protection: Surgical, Post-menopausal  Other Topics Concern  . Not on file  Social History Narrative  . Not on file   Social Determinants of Health   Financial Resource Strain: Not on file  Food Insecurity: Not on file  Transportation Needs: Not on file  Physical Activity: Not on file  Stress: Not on file  Social Connections: Not on file  Intimate Partner Violence: Not on file    ROS Review of Systems  Constitutional: Positive for fatigue (intermittently). Negative for chills, fever and unexpected weight change.  HENT: Positive for tinnitus. Negative for hearing loss.   Eyes: Negative for visual disturbance.  Respiratory: Negative for choking, chest tightness, shortness of breath and wheezing.   Cardiovascular: Negative for chest pain, palpitations and leg swelling.  Gastrointestinal: Positive for constipation (intermittent) and diarrhea (intermittent). Negative for abdominal pain, blood in stool, nausea, rectal pain and vomiting.  Endocrine: Negative for cold intolerance, heat intolerance, polydipsia, polyphagia and polyuria.  Genitourinary: Negative for dysuria, frequency, hematuria, urgency, vaginal bleeding, vaginal discharge and vaginal pain.  Musculoskeletal: Positive for back pain.  Allergic/Immunologic: Negative for environmental allergies and food allergies.  Neurological: Negative for dizziness, light-headedness and headaches.  Hematological:  Negative for adenopathy. Does not bruise/bleed easily.  Psychiatric/Behavioral: Positive for sleep disturbance. Negative for dysphoric mood, self-injury and suicidal ideas. The patient is not nervous/anxious.     Objective:   Today's Vitals: BP (!) 142/87   Pulse 72   Temp 98.2 F (36.8 C)   Ht 5\' 6"  (1.676 m)   Wt 183 lb 6.4 oz (83.2 kg)   LMP 10/17/2012   SpO2 100%   BMI 29.60 kg/m   Physical Exam Constitutional:      General: She is not  in acute distress.    Appearance: Normal appearance. She is not ill-appearing.  HENT:     Head: Normocephalic and atraumatic.     Right Ear: Tympanic membrane normal.     Left Ear: Tympanic membrane normal.     Nose: Nose normal.     Mouth/Throat:     Mouth: Mucous membranes are moist.     Pharynx: No oropharyngeal exudate or posterior oropharyngeal erythema.  Eyes:     Extraocular Movements: Extraocular movements intact.     Conjunctiva/sclera: Conjunctivae normal.     Pupils: Pupils are equal, round, and reactive to light.  Neck:     Thyroid: No thyromegaly.     Vascular: No carotid bruit or JVD.     Trachea: Trachea normal.  Cardiovascular:     Rate and Rhythm: Normal rate and regular rhythm.     Pulses: Normal pulses.     Heart sounds: Normal heart sounds. No murmur heard. No friction rub. No gallop.   Pulmonary:     Effort: Pulmonary effort is normal. No respiratory distress.     Breath sounds: Normal breath sounds. No wheezing.  Abdominal:     General: Bowel sounds are normal. There is no distension.     Palpations: Abdomen is soft.     Tenderness: There is abdominal tenderness (very mild to LLQ). There is no guarding.  Musculoskeletal:        General: Normal range of motion.     Cervical back: Normal range of motion and neck supple.  Skin:    General: Skin is warm and dry.  Neurological:     Mental Status: She is alert and oriented to person, place, and time.     Cranial Nerves: No cranial nerve deficit.  Psychiatric:        Mood and Affect: Mood normal.        Behavior: Behavior normal.        Thought Content: Thought content normal.        Judgment: Judgment normal.     Assessment & Plan:   1. Encounter to establish care Reviewed available information and discussed care concerns with patient. She is up to date on preventative care.   2. Annual physical exam Checking CBC, CMP, and Lipid panel.  - CBC - COMPLETE METABOLIC PANEL WITH GFR - Lipid  panel  3. Need for Tdap vaccination Tdap given in office today.  - Tdap vaccine greater than or equal to 7yo IM  4. Need for influenza vaccination Flu vaccine given in office today.  - Flu Vaccine QUAD 36+ mos IM  5. Screening for endocrine disorder Checking TSH and HgbA1c. - TSH - Hemoglobin A1c  6. Screening for HIV (human immunodeficiency virus) Discussed screening recommendations. Patient agreeable. Adding to labs today.  - HIV Antibody (routine testing w rflx)  7. Need for hepatitis C screening test Discussed screening recommendations. Patient agreeable. Adding to labs today.  - Hepatitis C antibody  8. Chronic bilateral low back pain without sciatica Trialing Meloxicam. Referring to PT. Consider following up with Dr. Darene Lamer if this is not improved in the next 4-6 weeks.  - Ambulatory referral to Physical Therapy  Outpatient Encounter Medications as of 12/22/2019  Medication Sig  . Biotin 10000 MCG TABS Take by mouth daily.  Marland Kitchen estradiol-norethindrone (ACTIVELLA) 1-0.5 MG tablet Take by mouth daily.  . Flaxseed, Linseed, (FLAX SEED OIL) 1300 MG CAPS Take by mouth daily.  . Multiple Vitamin (MULTIVITAMIN WITH MINERALS) TABS tablet Take 1 tablet by mouth daily.  . meloxicam (MOBIC) 15 MG tablet Take 1 tablet (15 mg total) by mouth daily.  . [DISCONTINUED] Calcium Carbonate-Vit D-Min (CALCIUM 1200 PO) Take 1 tablet by mouth daily.   . [DISCONTINUED] CALCIUM PO Take by mouth.  . [DISCONTINUED] cephALEXin (KEFLEX) 500 MG capsule TAKE 1 CAPSULE (500 MG TOTAL) BY MOUTH 2 TIMES DAILY FOR 10 DAYS.  . [DISCONTINUED] diclofenac (VOLTAREN) 75 MG EC tablet TAKE 1 TABLET BY MOUTH TWICE A DAY  . [DISCONTINUED] estradiol-norethindrone (ACTIVELLA) 1-0.5 MG tablet TAKE 1 TABLET BY MOUTH EVERY DAY  . [DISCONTINUED] fluorouracil (EFUDEX) 5 % cream Apply to face every night for 10-14 days.  . [DISCONTINUED] levocetirizine (XYZAL) 5 MG tablet TAKE 1 TABLET BY MOUTH EVERY DAY IN THE EVENING  .  [DISCONTINUED] Multiple Vitamin (MULTIVITAMIN) tablet Take by mouth.  . [DISCONTINUED] Nutritional Supplements (EQ ESTROBLEND MENOPAUSE PO) Take 1 tablet by mouth daily.   . [DISCONTINUED] Prenat-FeCbn-FeBisg-FA-Omega (MULTIVITAMIN/MINERALS PO) Take by mouth.  . [DISCONTINUED] valACYclovir (VALTREX) 1000 MG tablet TAKE 2 TABLETS BY MOUTH IN THE MORNING & 2TABS AT BEDTIME X 1 DAYS   No facility-administered encounter medications on file as of 12/22/2019.   Follow-up: Return in about 1 year (around 12/21/2020) for annual physical exam or sooner if needed.   Clearnce Sorrel, DNP, APRN, FNP-BC [facility]

## 2019-12-25 LAB — COMPLETE METABOLIC PANEL WITH GFR
AG Ratio: 1.7 (calc) (ref 1.0–2.5)
ALT: 16 U/L (ref 6–29)
AST: 18 U/L (ref 10–35)
Albumin: 4 g/dL (ref 3.6–5.1)
Alkaline phosphatase (APISO): 54 U/L (ref 37–153)
BUN: 12 mg/dL (ref 7–25)
CO2: 27 mmol/L (ref 20–32)
Calcium: 8.9 mg/dL (ref 8.6–10.4)
Chloride: 106 mmol/L (ref 98–110)
Creat: 0.9 mg/dL (ref 0.50–0.99)
GFR, Est African American: 81 mL/min/{1.73_m2} (ref >=60)
GFR, Est Non African American: 69 mL/min/{1.73_m2} (ref >=60)
Globulin: 2.3 g/dL (calc) (ref 1.9–3.7)
Glucose, Bld: 92 mg/dL (ref 65–99)
Potassium: 3.7 mmol/L (ref 3.5–5.3)
Sodium: 140 mmol/L (ref 135–146)
Total Bilirubin: 0.4 mg/dL (ref 0.2–1.2)
Total Protein: 6.3 g/dL (ref 6.1–8.1)

## 2019-12-25 LAB — CBC
HCT: 43.2 % (ref 35.0–45.0)
Hemoglobin: 15.2 g/dL (ref 11.7–15.5)
MCH: 33 pg (ref 27.0–33.0)
MCHC: 35.2 g/dL (ref 32.0–36.0)
MCV: 93.9 fL (ref 80.0–100.0)
MPV: 11.6 fL (ref 7.5–12.5)
Platelets: 245 10*3/uL (ref 140–400)
RBC: 4.6 10*6/uL (ref 3.80–5.10)
RDW: 11.5 % (ref 11.0–15.0)
WBC: 6.2 10*3/uL (ref 3.8–10.8)

## 2019-12-25 LAB — HEMOGLOBIN A1C
Hgb A1c MFr Bld: 5.5 % of total Hgb (ref <5.7)
Mean Plasma Glucose: 111 mg/dL
eAG (mmol/L): 6.2 mmol/L

## 2019-12-25 LAB — LIPID PANEL
Cholesterol: 187 mg/dL (ref <200)
HDL: 54 mg/dL (ref >=50)
LDL Cholesterol (Calc): 101 mg/dL (calc) — ABNORMAL HIGH
Non-HDL Cholesterol (Calc): 133 mg/dL (calc) — ABNORMAL HIGH (ref <130)
Total CHOL/HDL Ratio: 3.5 (calc) (ref <5.0)
Triglycerides: 198 mg/dL — ABNORMAL HIGH (ref <150)

## 2019-12-25 LAB — HIV ANTIBODY (ROUTINE TESTING W REFLEX): HIV 1&2 Ab, 4th Generation: NONREACTIVE

## 2019-12-25 LAB — HEPATITIS C ANTIBODY
Hepatitis C Ab: NONREACTIVE
SIGNAL TO CUT-OFF: 0 (ref <1.00)

## 2019-12-25 LAB — TSH: TSH: 2.03 mIU/L (ref 0.40–4.50)

## 2019-12-28 ENCOUNTER — Other Ambulatory Visit: Payer: Self-pay

## 2019-12-28 ENCOUNTER — Encounter: Payer: Self-pay | Admitting: Physical Therapy

## 2019-12-28 ENCOUNTER — Ambulatory Visit (INDEPENDENT_AMBULATORY_CARE_PROVIDER_SITE_OTHER): Payer: 59 | Admitting: Physical Therapy

## 2019-12-28 DIAGNOSIS — G8929 Other chronic pain: Secondary | ICD-10-CM | POA: Diagnosis not present

## 2019-12-28 DIAGNOSIS — M545 Low back pain, unspecified: Secondary | ICD-10-CM

## 2019-12-28 DIAGNOSIS — M62830 Muscle spasm of back: Secondary | ICD-10-CM

## 2019-12-28 NOTE — Therapy (Signed)
[place], [place], [address] Phone: [phone]   Fax:  [phone]  Physical Therapy Evaluation  Patient Details  Name: Susan Ramirez MRN: [id number] Date of Birth: [birth year] Referring Provider (PT): Samuel Bouche NP   Encounter Date: 12/28/2019   PT End of Session - 12/28/19 0933    Visit Number 1    Number of Visits 4    Date for PT Re-Evaluation 01/25/20    Authorization Type UHC    PT Start Time 0933    PT Stop Time 1031    PT Time Calculation (min) 58 min    Activity Tolerance Patient tolerated treatment well    Behavior During Therapy [facility] for tasks assessed/performed           Past Medical History:  Diagnosis Date  . Heart murmur    Only during childhood  . PONV (postoperative nausea and vomiting)   . Postmenopausal hormone replacement therapy     Past Surgical History:  Procedure Laterality Date  . APPENDECTOMY    . ELBOW SURGERY    . EYE SURGERY    . KNEE ARTHROSCOPY Bilateral   . SHOULDER ARTHROSCOPY    . SHOULDER ARTHROSCOPY WITH DISTAL CLAVICLE RESECTION Right 11/17/2012   Procedure: RIGHT SHOULDER ARTHROSCOPY WITH SUBACROMIAL DECOMPRESSION DISTAL CLAVICLE RESECTION;  Surgeon: Marin Shutter, MD;  Location: [facility];  Service: Orthopedics;  Laterality: Right;  . TONSILLECTOMY      There were no vitals filed for this visit.    Subjective Assessment - 12/28/19 0933    Subjective Pt reports she has had low back pain since May after she moved.  Saw orthopedic, had MRI and it was negative. Heat seems to help some, started mobic Monday.  Tries to stretch in the morning - temporary relief.  Feels like constant ache in low back and upper buttocks.    Diagnostic tests MRI (-)    Patient Stated Goals get rid of pain    Currently in Pain? Yes    Pain Score 5    worse at end of day   Pain Location Buttocks    Pain Orientation Lower   Lt > Rt   Pain Descriptors / Indicators  Aching;Sore    Pain Type Chronic pain    Pain Onset More than a month ago    Pain Frequency Constant    Aggravating Factors  daily activity    Pain Relieving Factors heat - temporary              [facility] PT Assessment - 12/28/19 0001      Assessment   Medical Diagnosis Chronic LBP    Referring Provider (PT) Samuel Bouche NP    Onset Date/Surgical Date 05/20/19    Next MD Visit PRN    Prior Therapy not for back      Precautions   Precautions None      Balance Screen   Has the patient fallen in the past 6 months No    Has the patient had a decrease in activity level because of a fear of falling?  No    Is the patient reluctant to leave their home because of a fear of falling?  No      Home Environment   Living Environment Private residence    Home Layout Two level   no trouble     Prior Function   Level of Independence Independent    Vocation --  self employes   Vocation Requirements desk work mainly    Leisure gym, work in yard      Observation/Other Assessments   Focus on Therapeutic Outcomes (FOTO)  48%      Sensation   Light Touch Appears Intact    Hot/Cold Appears Intact      Posture/Postural Control   Posture/Postural Control Postural limitations    Postural Limitations Increased lumbar lordosis      ROM / Strength   AROM / PROM / Strength AROM;Strength      AROM   AROM Assessment Site Lumbar;Hip    Lumbar Flexion to floor - pulling sensation    Lumbar Extension WNL    Lumbar - Right Rotation WNL    Lumbar - Left Rotation WNL      Strength   Strength Assessment Site Hip;Knee;Ankle;Lumbar    Right/Left Hip --   5/5   Right/Left Knee --   5/5   Right/Left Ankle --   5/5     Flexibility   Soft Tissue Assessment /Muscle Length --   Bilat LE's WNL     Palpation   Spinal mobility hypomobile sacrum and L5/4 CPA and bilat UPA    Palpation comment tight and tender in LT QL,lumbar paraspinals, gluts and piriformis.  Slight tightness Rt side however not  tender to palpation.      Special Tests   Other special tests (-) lumbar special tests                      Objective measurements completed on examination: See above findings.       [facility] Adult PT Treatment/Exercise - 12/28/19 0001      Exercises   Exercises Lumbar      Lumbar Exercises: Stretches   Lower Trunk Rotation 10 seconds;3 reps    Other Lumbar Stretch Exercise childs pose      Lumbar Exercises: Quadruped   Madcat/Old Horse 10 reps      Modalities   Modalities Electrical Stimulation;Moist Heat      Moist Heat Therapy   Number Minutes Moist Heat 10 Minutes    Moist Heat Location Lumbar Spine      Electrical Stimulation   Electrical Stimulation Location lumbar    Electrical Stimulation Action IFC    Electrical Stimulation Parameters to tolerance    Electrical Stimulation Goals Pain;Tone      Manual Therapy   Manual Therapy Soft tissue mobilization    Manual therapy comments skilled palpation and monitoring during dry needling    Soft tissue mobilization STM to bilat lumbar paraspinals and lt gluts                  PT Education - 12/28/19 1258    Education Details HEP, POC and FOTO    Person(s) Educated Patient    Methods Explanation;Demonstration;Handout    Comprehension Returned demonstration;Verbalized understanding               PT Long Term Goals - 12/28/19 1305      PT LONG TERM GOAL #1   Title I with HEP to include return to gym program    Time 4    Period Weeks    Status New    Target Date 01/25/20      PT LONG TERM GOAL #2   Title report =/> 75% reduction of pain/tightness in her low back    Time 4    Period Weeks    Status New  Target Date 01/25/20      PT LONG TERM GOAL #3   Title improve FOTO =/< 36% limited    Time 4    Period Weeks    Status New    Target Date 01/25/20      PT LONG TERM GOAL #4   Title tolerated simulated yard work with minimal to no low back pain    Time 4    Period Weeks     Status New    Target Date 01/25/20                  Plan - 12/28/19 1031    Clinical Impression Statement 59 y/o female that reports low back pain that began after moving this past May.  She has tried heat and some stretches with minimal relief. Pt used to be very active and go to the gym, Her pain is limiting the gym, she does continue to be active trying to work through the pain.  Lumbar and LE ROM and lower body strenght is WNL.  She has some hypomobility in the sacrum and lower lumbar vertebrae as well as tightness in the lower lumbar paraspinals and Lt gluts and piriformis.  She tolerated DN well and reported decreased tightness at end of session.  She would benefit from continued PT to decrease muscular tightness and restore PLOF.    Personal Factors and Comorbidities Comorbidity 2    Comorbidities arthritits, multiple knee surgeries bilat    Examination-Activity Limitations Other    Examination-Participation Restrictions Yard Work;Other    Stability/Clinical Decision Making Stable/Uncomplicated    Clinical Decision Making Low    Rehab Potential Excellent    PT Frequency 1x / week    PT Duration 4 weeks    PT Treatment/Interventions Taping;Patient/family education;Moist Heat;Functional mobility training;Electrical Stimulation;Cryotherapy;Manual techniques;Dry needling;Spinal Manipulations    PT Next Visit Plan cont DN PRN, functional strengthening    PT Home Exercise Plan [email or web address]    Consulted and Agree with Plan of Care Patient           Patient will benefit from skilled therapeutic intervention in order to improve the following deficits and impairments:  Hypomobility,Pain,Increased muscle spasms  Visit Diagnosis: Chronic bilateral low back pain without sciatica - Plan: PT plan of care cert/re-cert  Muscle spasm of back - Plan: PT plan of care cert/re-cert     Problem List Patient Active Problem List   Diagnosis Date Noted  . Low back pain 07/20/2019  . Fever  blister 09/16/2018  . History of colonic polyps 04/06/2017  . Chronic cough 08/10/2016  . DEPRESSION 05/21/2009  . IRRITABLE BOWEL SYNDROME 05/21/2009  . CHANGE IN BOWELS 05/21/2009    Jeral Pinch PT  12/28/2019, 1:07 PM  [facility] [place], [place], [address] Phone: [phone]   Fax:  [phone]  Name: Susan Ramirez MRN: [id number] Date of Birth: 04/16/1960

## 2019-12-28 NOTE — Patient Instructions (Signed)
Access Code: [email or web address] URL: [email or web address] Date: 12/28/2019 Prepared by: Jeral Pinch  Exercises Cat Cow - 2 x daily - 10 reps Cat-Camel to Child's Pose - 2 x daily - 1 reps - 20-30 hold Supine Lower Trunk Rotation - 2 x daily - 10 reps - 5-10 hold  Patient Education Trigger Point Dry Needling

## 2020-01-04 ENCOUNTER — Ambulatory Visit (INDEPENDENT_AMBULATORY_CARE_PROVIDER_SITE_OTHER): Payer: 59 | Admitting: Rehabilitative and Restorative Service Providers"

## 2020-01-04 ENCOUNTER — Other Ambulatory Visit: Payer: Self-pay

## 2020-01-04 DIAGNOSIS — M545 Low back pain, unspecified: Secondary | ICD-10-CM | POA: Diagnosis not present

## 2020-01-04 DIAGNOSIS — G8929 Other chronic pain: Secondary | ICD-10-CM | POA: Diagnosis not present

## 2020-01-04 DIAGNOSIS — M62830 Muscle spasm of back: Secondary | ICD-10-CM

## 2020-01-04 NOTE — Therapy (Signed)
[place]  [place], [place], [address] Phone: [phone]   Fax:  [phone]  Physical Therapy Treatment  Patient Details  Name: Susan Ramirez MRN: [id number] Date of Birth: 10/03/1960 Referring Provider (PT): Samuel Bouche NP   Encounter Date: 01/04/2020   PT End of Session - 01/04/20 1339    Visit Number 2    Number of Visits 4    Date for PT Re-Evaluation 01/25/20    Authorization Type UHC    PT Start Time 1020    PT Stop Time 1105    PT Time Calculation (min) 45 min    Activity Tolerance Patient tolerated treatment well    Behavior During Therapy [facility] for tasks assessed/performed           Past Medical History:  Diagnosis Date  . Heart murmur    Only during childhood  . PONV (postoperative nausea and vomiting)   . Postmenopausal hormone replacement therapy     Past Surgical History:  Procedure Laterality Date  . APPENDECTOMY    . ELBOW SURGERY    . EYE SURGERY    . KNEE ARTHROSCOPY Bilateral   . SHOULDER ARTHROSCOPY    . SHOULDER ARTHROSCOPY WITH DISTAL CLAVICLE RESECTION Right 11/17/2012   Procedure: RIGHT SHOULDER ARTHROSCOPY WITH SUBACROMIAL DECOMPRESSION DISTAL CLAVICLE RESECTION;  Surgeon: Marin Shutter, MD;  Location: [facility];  Service: Orthopedics;  Laterality: Right;  . TONSILLECTOMY      There were no vitals filed for this visit.   Subjective Assessment - 01/04/20 1019    Subjective The patient notes that pain has improved since initiation of therapy.  Pain is in glut medius region and is aggravated by heavy lifting.    Diagnostic tests MRI (-)    Patient Stated Goals get rid of pain    Currently in Pain? Yes    Pain Score 2     Pain Location Back    Pain Orientation Lower    Pain Descriptors / Indicators Tightness;Aching;Sore    Pain Type Chronic pain    Pain Onset More than a month ago    Aggravating Factors  faily activity    Pain Relieving Factors heat-temporary               [facility] PT Assessment - 01/04/20 1021      Assessment   Medical Diagnosis Chronic LBP    Referring Provider (PT) Samuel Bouche NP    Onset Date/Surgical Date 05/20/19    Next MD Visit PRN                         [facility] Adult PT Treatment/Exercise - 01/04/20 1021      Exercises   Exercises Lumbar      Lumbar Exercises: Stretches   Lower Trunk Rotation 3 reps;10 seconds    Piriformis Stretch Right;Left;3 reps;30 seconds    Other Lumbar Stretch Exercise child's pose      Lumbar Exercises: Aerobic   Nustep level 5 x 3 minutes      Lumbar Exercises: Standing   Other Standing Lumbar Exercises Hip abduction x x band walk with green band x 10 reps R and L      Lumbar Exercises: Supine   Bridge 10 reps    Bridge with March 10 reps      Lumbar Exercises: Sidelying   Hip Abduction Left;5 reps    Hip Abduction Limitations too easy  Lumbar Exercises: Quadruped   Madcat/Old Horse 10 reps    Other Quadruped Lumbar Exercises sitting into child's pose      Moist Heat Therapy   Number Minutes Moist Heat 10 Minutes    Moist Heat Location --   no charge-- remained in clinic on heat x 10 minutes after session     Manual Therapy   Manual Therapy Soft tissue mobilization    Manual therapy comments skilled palpation and monitoring during dry needling; self mobilization with ball against wall    Soft tissue mobilization STM to bilat lumbar paraspinals, L sacral border and gluts/piriformis            Trigger Point Dry Needling - 01/04/20 1044    Consent Given? Yes    Education Handout Provided Previously provided    Muscles Treated Back/Hip Gluteus medius;Piriformis;Lumbar multifidi    Dry Needling Comments L side    Gluteus Medius Response Twitch response elicited;Palpable increased muscle length    Piriformis Response Palpable increased muscle length    Lumbar multifidi Response Palpable increased muscle length                PT Education -  01/04/20 1337    Education Details HEP progression    Person(s) Educated Patient    Methods Explanation;Demonstration;Handout    Comprehension Verbalized understanding;Returned demonstration               PT Long Term Goals - 12/28/19 1305      PT LONG TERM GOAL #1   Title I with HEP to include return to gym program    Time 4    Period Weeks    Status New    Target Date 01/25/20      PT LONG TERM GOAL #2   Title report =/> 75% reduction of pain/tightness in her low back    Time 4    Period Weeks    Status New    Target Date 01/25/20      PT LONG TERM GOAL #3   Title improve FOTO =/< 36% limited    Time 4    Period Weeks    Status New    Target Date 01/25/20      PT LONG TERM GOAL #4   Title tolerated simulated yard work with minimal to no low back pain    Time 4    Period Weeks    Status New    Target Date 01/25/20                 Plan - 01/04/20 1339    Clinical Impression Statement The patient has improved pain at rest.  With palpation, she is point tender over lateral border of the sacrum and with compression.  She tolerated STM and DN L gluts and multifidi.  PT progressed ther ex to toleance.  Plan to continue to work to [employer].    PT Treatment/Interventions Taping;Patient/family education;Moist Heat;Functional mobility training;Electrical Stimulation;Cryotherapy;Manual techniques;Dry needling;Spinal Manipulations    PT Next Visit Plan DN/STM L lateral sacral border, functional strengthening, piriformis and SI stretching, hip flexor stretching an dmuscle energy techniques    PT Home Exercise Plan [email or web address]    Consulted and Agree with Plan of Care Patient           Patient will benefit from skilled therapeutic intervention in order to improve the following deficits and impairments:     Visit Diagnosis: Chronic bilateral low back pain without sciatica  Muscle spasm of back  Problem List Patient Active Problem List   Diagnosis Date Noted  .  Low back pain 07/20/2019  . Fever blister 09/16/2018  . History of colonic polyps 04/06/2017  . Chronic cough 08/10/2016  . DEPRESSION 05/21/2009  . IRRITABLE BOWEL SYNDROME 05/21/2009  . CHANGE IN BOWELS 05/21/2009    Marjon Doxtater, PT 01/04/2020, 1:43 PM  [facility] [place], [place], [address] Phone: [phone]   Fax:  [phone]  Name: DANAIYA DEPAEPE MRN: [id number] Date of Birth: 01/02/1961

## 2020-01-04 NOTE — Patient Instructions (Signed)
Access Code: [email or web address] URL: [email or web address] Date: 01/04/2020 Prepared by: Margretta Ditty  Exercises Cat Cow - 2 x daily - 10 reps Cat-Camel to Child's Pose - 2 x daily - 1 reps - 20-30 hold Supine Lower Trunk Rotation - 2 x daily - 10 reps - 5-10 hold Supine Double Knee to Chest Modified - 2 x daily - 7 x weekly - 1 sets - 10 reps X Band Walk - 2 x daily - 7 x weekly - 1 sets - 15-20 reps Seated Figure 4 Piriformis Stretch - 2 x daily - 7 x weekly - 1 sets - 3 reps - 30 seconds hold Leg Swing Single Leg Balance - 2 x daily - 7 x weekly - 1 sets - 5 reps Isometric Gluteus Medius at Wall - 2 x daily - 7 x weekly - 1 sets - 10 reps Standing Quadratus Lumborum Mobilization with Small Ball on Wall - 2 x daily - 7 x weekly - 1 sets - 1 reps

## 2020-01-11 ENCOUNTER — Encounter: Payer: Self-pay | Admitting: Physical Therapy

## 2020-01-11 ENCOUNTER — Ambulatory Visit (INDEPENDENT_AMBULATORY_CARE_PROVIDER_SITE_OTHER): Payer: 59 | Admitting: Physical Therapy

## 2020-01-11 ENCOUNTER — Other Ambulatory Visit: Payer: Self-pay

## 2020-01-11 DIAGNOSIS — M62830 Muscle spasm of back: Secondary | ICD-10-CM | POA: Diagnosis not present

## 2020-01-11 DIAGNOSIS — G8929 Other chronic pain: Secondary | ICD-10-CM | POA: Diagnosis not present

## 2020-01-11 DIAGNOSIS — M545 Low back pain, unspecified: Secondary | ICD-10-CM | POA: Diagnosis not present

## 2020-01-11 NOTE — Therapy (Signed)
[facility] [address] [place], [place], [address] Phone: [phone]   Fax:  [phone]  Physical Therapy Treatment  Patient Details  Name: Susan Ramirez MRN: [id number] Date of Birth: 1960-05-27 Referring Provider (PT): Christen Butter NP   Encounter Date: 01/11/2020   PT End of Session - 01/11/20 0844    Visit Number 3    Number of Visits 4    Date for PT Re-Evaluation 01/25/20    Authorization Type UHC    PT Start Time 0844    PT Stop Time 0933    PT Time Calculation (min) 49 min    Activity Tolerance Patient tolerated treatment well    Behavior During Therapy [facility] for tasks assessed/performed           Past Medical History:  Diagnosis Date  . Heart murmur    Only during childhood  . PONV (postoperative nausea and vomiting)   . Postmenopausal hormone replacement therapy     Past Surgical History:  Procedure Laterality Date  . APPENDECTOMY    . ELBOW SURGERY    . EYE SURGERY    . KNEE ARTHROSCOPY Bilateral   . SHOULDER ARTHROSCOPY    . SHOULDER ARTHROSCOPY WITH DISTAL CLAVICLE RESECTION Right 11/17/2012   Procedure: RIGHT SHOULDER ARTHROSCOPY WITH SUBACROMIAL DECOMPRESSION DISTAL CLAVICLE RESECTION;  Surgeon: Senaida Lange, MD;  Location: [facility];  Service: Orthopedics;  Laterality: Right;  . TONSILLECTOMY      There were no vitals filed for this visit.   Subjective Assessment - 01/11/20 0848    Subjective Pt reports she did the new ther ex at home and wasn't able to hardly move for the next two days.  She has stopped those    Patient Stated Goals get rid of pain    Currently in Pain? Yes    Pain Score 2     Pain Location Back    Pain Orientation Lower   at belt line   Pain Descriptors / Indicators Aching    Pain Type Chronic pain    Pain Onset More than a month ago    Aggravating Factors  new HEP    Pain Relieving Factors heat                             [facility] Adult PT  Treatment/Exercise - 01/11/20 0001      Lumbar Exercises: Stretches   Piriformis Stretch Left;Right;20 seconds   seated     Lumbar Exercises: Aerobic   Tread Mill 2. x 5' ,starting to feel some pull in glut med    Nustep --      Lumbar Exercises: Sidelying   Clam 15 reps;Both   regular and reverse     Lumbar Exercises: Quadruped   Madcat/Old Horse 10 reps    Other Quadruped Lumbar Exercises sitting into child's pose      Moist Heat Therapy   Number Minutes Moist Heat 10 Minutes    Moist Heat Location Lumbar Spine   gluts     Electrical Stimulation   Electrical Stimulation Location lumbar   gluts   Electrical Stimulation Action IFC    Electrical Stimulation Parameters to tolerance    Electrical Stimulation Goals Pain;Tone      Manual Therapy   Manual Therapy Soft tissue mobilization    Manual therapy comments skilled palpation and monitoring during dry needling; self mobilization with ball against wall  Soft tissue mobilization STM to bilat lumbar paraspinals, L sacral border and gluts/piriformis            Trigger Point Dry Needling - 01/11/20 0001    Consent Given? Yes    Education Handout Provided Previously provided    Muscles Treated Back/Hip Gluteus maximus;Iliacus    Gluteus Maximus Response Palpable increased muscle length   bilat right next to SIJ   Iliacus Response Palpable increased muscle length   Rt                    PT Long Term Goals - 12/28/19 1305      PT LONG TERM GOAL #1   Title I with HEP to include return to gym program    Time 4    Period Weeks    Status New    Target Date 01/25/20      PT LONG TERM GOAL #2   Title report =/> 75% reduction of pain/tightness in her low back    Time 4    Period Weeks    Status New    Target Date 01/25/20      PT LONG TERM GOAL #3   Title improve FOTO =/< 36% limited    Time 4    Period Weeks    Status New    Target Date 01/25/20      PT LONG TERM GOAL #4   Title tolerated  simulated yard work with minimal to no low back pain    Time 4    Period Weeks    Status New    Target Date 01/25/20                 Plan - 01/11/20 0924    Clinical Impression Statement Susan Ramirez had a bad response to the increased HEP.  This was modified and the standing strengthening was removed.  She is going to try sidlying glut med work and then progress back to standing.  she had a great response to DN today and reported being painfree at end of session.  She has one more scheduled appointment    Rehab Potential Excellent    PT Frequency 1x / week    PT Duration 4 weeks    PT Treatment/Interventions Taping;Patient/family education;Moist Heat;Functional mobility training;Electrical Stimulation;Cryotherapy;Manual techniques;Dry needling;Spinal Manipulations    PT Next Visit Plan reassess for d/c vs renewal, DN PRN and progress HEP if ready    PT Home Exercise Plan [id number]    Consulted and Agree with Plan of Care Patient           Patient will benefit from skilled therapeutic intervention in order to improve the following deficits and impairments:  Hypomobility,Pain,Increased muscle spasms  Visit Diagnosis: Chronic bilateral low back pain without sciatica  Muscle spasm of back     Problem List Patient Active Problem List   Diagnosis Date Noted  . Low back pain 07/20/2019  . Fever blister 09/16/2018  . History of colonic polyps 04/06/2017  . Chronic cough 08/10/2016  . DEPRESSION 05/21/2009  . IRRITABLE BOWEL SYNDROME 05/21/2009  . CHANGE IN BOWELS 05/21/2009    Jeral Pinch PT  01/11/2020, 9:26 AM  [facility] [address] [place], [place], [address] Phone: [phone]   Fax:  [phone]  Name: Susan Ramirez MRN: [id number] Date of Birth: Apr 12, 1960

## 2020-01-18 ENCOUNTER — Ambulatory Visit (INDEPENDENT_AMBULATORY_CARE_PROVIDER_SITE_OTHER): Payer: 59 | Admitting: Physical Therapy

## 2020-01-18 ENCOUNTER — Other Ambulatory Visit: Payer: Self-pay

## 2020-01-18 ENCOUNTER — Encounter: Payer: Self-pay | Admitting: Physical Therapy

## 2020-01-18 DIAGNOSIS — M545 Low back pain, unspecified: Secondary | ICD-10-CM

## 2020-01-18 DIAGNOSIS — M62830 Muscle spasm of back: Secondary | ICD-10-CM | POA: Diagnosis not present

## 2020-01-18 DIAGNOSIS — G8929 Other chronic pain: Secondary | ICD-10-CM

## 2020-01-18 NOTE — Therapy (Signed)
[place], [place], [address] Phone: [phone]   Fax:  [phone]  Physical Therapy Treatment/Discharge  Patient Details  Name: Susan Ramirez MRN: [id number] Date of Birth: 1960-10-11 Referring Provider (PT): Samuel Bouche NP   Encounter Date: 01/18/2020   PT End of Session - 01/18/20 0805    Visit Number 4    Authorization Type UHC    PT Start Time 0805    PT Stop Time 0856    PT Time Calculation (min) 51 min    Activity Tolerance Patient tolerated treatment well    Behavior During Therapy [facility] for tasks assessed/performed           Past Medical History:  Diagnosis Date  . Heart murmur    Only during childhood  . PONV (postoperative nausea and vomiting)   . Postmenopausal hormone replacement therapy     Past Surgical History:  Procedure Laterality Date  . APPENDECTOMY    . ELBOW SURGERY    . EYE SURGERY    . KNEE ARTHROSCOPY Bilateral   . SHOULDER ARTHROSCOPY    . SHOULDER ARTHROSCOPY WITH DISTAL CLAVICLE RESECTION Right 11/17/2012   Procedure: RIGHT SHOULDER ARTHROSCOPY WITH SUBACROMIAL DECOMPRESSION DISTAL CLAVICLE RESECTION;  Surgeon: Marin Shutter, MD;  Location: [facility];  Service: Orthopedics;  Laterality: Right;  . TONSILLECTOMY      There were no vitals filed for this visit.   Subjective Assessment - 01/18/20 0807    Subjective Pt reports she is doing alot better and feels like she can be discharged. She did have some discomfort on Monday but good since    Patient Stated Goals get rid of pain    Currently in Pain? Yes    Pain Score 1     Pain Location Back    Pain Orientation Lower    Pain Descriptors / Indicators Simonne Martinet PT Assessment - 01/18/20 0001      Assessment   Medical Diagnosis Chronic LBP    Referring Provider (PT) Samuel Bouche NP    Onset Date/Surgical Date 05/20/19      Observation/Other Assessments   Focus on Therapeutic Outcomes  (FOTO)  28%      AROM   Lumbar Flexion WNL slight pulling    Lumbar Extension WNL    Lumbar - Right Rotation WNL    Lumbar - Left Rotation WNL                         [facility] Adult PT Treatment/Exercise - 01/18/20 0001      Lumbar Exercises: Stretches   Double Knee to Chest Stretch 2 reps    Piriformis Stretch Left;Right;30 seconds      Lumbar Exercises: Aerobic   Tread Mill 2.49mh x 5' ,starting to feel some pull in glut med      Lumbar Exercises: Standing   Other Standing Lumbar Exercises 10 reps modified dead lift with 20# only lowering to chair      Lumbar Exercises: Sidelying   Other Sidelying Lumbar Exercises 10 reps each pilates FWD/BWD kicks, CW/CCW circles, FWD/BWD taps      Lumbar Exercises: Quadruped   Madcat/Old Horse 10 reps    Other Quadruped Lumbar Exercises sitting into child's pose      Moist Heat Therapy   Number Minutes Moist Heat 10 Minutes    Moist Heat Location Lumbar  Spine      Chemical engineer Action IFC    Electrical Stimulation Parameters to tolerance    Electrical Stimulation Goals Tone      Manual Therapy   Manual therapy comments skilled palpation and monitoring during dry needling; self mobilization with ball against wall    Soft tissue mobilization STM to bilat lumbar paraspinals, L sacral border and gluts/piriformis            Trigger Point Dry Needling - 01/18/20 0001    Consent Given? Yes    Education Handout Provided Previously provided    Muscles Treated Back/Hip Lumbar multifidi;Erector spinae    Electrical Stimulation Performed with Dry Needling Yes    Erector spinae Response Palpable increased muscle length   bilat with stim   Lumbar multifidi Response Palpable increased muscle length   with stim                    PT Long Term Goals - 01/18/20 0174      PT LONG TERM GOAL #1   Title I with HEP to include return to gym  program    Status Achieved      PT LONG TERM GOAL #2   Title report =/> 75% reduction of pain/tightness in her low back    Baseline 99% improvement    Status Achieved      PT LONG TERM GOAL #3   Title improve FOTO =/< 36% limited    Baseline 28% limited    Status Achieved      PT LONG TERM GOAL #4   Title tolerated simulated yard work with minimal to no low back pain    Baseline had minimal tightness in the low back with this    Status Achieved                 Plan - 01/18/20 0813    Clinical Impression Statement Susan Ramirez is verypleased with her progress, she is at her baseline with tightness/pain.  She has not returned to all her lifting, she is slowly adding that back in.  Susan Ramirez has met all her goals and is readyfor discharge to her HEP and the gym    PT Next Visit Plan discharged to [place] [id number]    Consulted and Agree with Plan of Care Patient           Patient will benefit from skilled therapeutic intervention in order to improve the following deficits and impairments:     Visit Diagnosis: Chronic bilateral low back pain without sciatica  Muscle spasm of back     Problem List Patient Active Problem List   Diagnosis Date Noted  . Low back pain 07/20/2019  . Fever blister 09/16/2018  . History of colonic polyps 04/06/2017  . Chronic cough 08/10/2016  . DEPRESSION 05/21/2009  . IRRITABLE BOWEL SYNDROME 05/21/2009  . CHANGE IN BOWELS 05/21/2009    Jeral Pinch PT  01/18/2020, 12:32 PM  [facility] [place], [place], [address] Phone: [phone]   Fax:  [phone]  Name: Susan Ramirez MRN: [id number] Date of Birth: 01-05-1961  PHYSICAL THERAPY DISCHARGE SUMMARY  Visits from Start of Care: 4  Current functional level related to goals / functional outcomes: See above for current ROM and goal update   Remaining deficits: Some weakness, this will improve  with her HEP  Education / Equipment: HEP Plan: Patient agrees to discharge.  Patient goals were met. Patient is being discharged due to meeting the stated rehab goals.  ?????Susan Ramirez is pleased with her current functional level    Jeral Pinch, PT 01/18/20 12:33 PM

## 2020-01-21 ENCOUNTER — Other Ambulatory Visit: Payer: Self-pay | Admitting: Medical-Surgical

## 2020-01-29 ENCOUNTER — Other Ambulatory Visit: Payer: Self-pay

## 2020-01-29 ENCOUNTER — Ambulatory Visit (AMBULATORY_SURGERY_CENTER): Payer: Self-pay | Admitting: *Deleted

## 2020-01-29 VITALS — Ht 66.0 in | Wt 183.0 lb

## 2020-01-29 DIAGNOSIS — Z86010 Personal history of colonic polyps: Secondary | ICD-10-CM

## 2020-01-29 MED ORDER — PLENVU 140 G PO SOLR: 1.0000 | Freq: Once | ORAL | 0 refills | Status: AC

## 2020-01-29 NOTE — Progress Notes (Signed)
Patient is here in-person for PV. Patient denies any allergies to eggs or soy. Patient denies any problems with anesthesia/sedation. Patient denies any oxygen use at home. PONV. Patient denies taking any diet/weight loss medications or blood thinners. Patient is not being treated for MRSA or C-diff. Patient is aware of our care-partner policy and UEAVW-09 safety protocol.  COVID-19 vaccines completed on 09/2019 x2, per patient.   Prep Prescription coupon given to the patient.

## 2020-02-12 ENCOUNTER — Telehealth: Payer: Self-pay | Admitting: Internal Medicine

## 2020-02-12 DIAGNOSIS — Z860100 Personal history of colon polyps, unspecified: Secondary | ICD-10-CM

## 2020-02-12 MED ORDER — PLENVU 140 G PO SOLR: 1.0000 | ORAL | 0 refills | Status: DC

## 2020-02-12 NOTE — Telephone Encounter (Signed)
PLENVU  PHARMACY AS PER PT'S REQUEST

## 2020-02-12 NOTE — Telephone Encounter (Signed)
Pt states her pharmacy has not received Grandin

## 2020-02-14 ENCOUNTER — Encounter: Payer: Self-pay | Admitting: Internal Medicine

## 2020-02-15 ENCOUNTER — Telehealth: Payer: Self-pay | Admitting: Internal Medicine

## 2020-02-15 NOTE — Telephone Encounter (Signed)
Patient is having a hard time getting Plenvu prep from the pharmacy.  Please call pharmacy to see if they can substitute for another prep that is in stock.  Please call patient.

## 2020-02-15 NOTE — Telephone Encounter (Signed)
Called [facility] states pt PICKED UP PLENVU 2-3 hours ago

## 2020-02-19 ENCOUNTER — Other Ambulatory Visit: Payer: Self-pay

## 2020-02-19 ENCOUNTER — Ambulatory Visit (AMBULATORY_SURGERY_CENTER): Payer: 59 | Admitting: Internal Medicine

## 2020-02-19 ENCOUNTER — Encounter: Payer: Self-pay | Admitting: Internal Medicine

## 2020-02-19 VITALS — BP 111/77 | HR 67 | Temp 97.3°F | Resp 17 | Ht 66.0 in | Wt 183.0 lb

## 2020-02-19 DIAGNOSIS — Z86010 Personal history of colonic polyps: Secondary | ICD-10-CM | POA: Diagnosis present

## 2020-02-19 MED ORDER — SODIUM CHLORIDE 0.9 % IV SOLN: 500.0000 mL | Freq: Once | INTRAVENOUS | Status: DC

## 2020-02-19 NOTE — Progress Notes (Signed)
Pt's states no medical or surgical changes since previsit or office visit. 

## 2020-02-19 NOTE — Progress Notes (Signed)
M.O. vital signs.

## 2020-02-19 NOTE — Op Note (Signed)
[place] Patient Name: Susan Ramirez Procedure Date: 02/19/2020 7:56 AM MRN: [id number] Endoscopist: Jerene Bears , MD Age: 60 Referring MD:  Date of Birth: 1960/10/10 Gender: Female Account #: [id number] Procedure:                Colonoscopy Indications:              High risk colon cancer surveillance: Personal                            history of non-advanced adenoma, Last colonoscopy:                            August 2016 Medicines:                Monitored Anesthesia Care Procedure:                Pre-Anesthesia Assessment:                           - Prior to the procedure, a History and Physical                            was performed, and patient medications and                            allergies were reviewed. The patient's tolerance of                            previous anesthesia was also reviewed. The risks                            and benefits of the procedure and the sedation                            options and risks were discussed with the patient.                            All questions were answered, and informed consent                            was obtained. Prior Anticoagulants: The patient has                            taken no previous anticoagulant or antiplatelet                            agents. ASA Grade Assessment: I - A normal, healthy                            patient. After reviewing the risks and benefits,                            the patient was deemed in satisfactory condition to  undergo the procedure.                           After obtaining informed consent, the colonoscope                            was passed under direct vision. Throughout the                            procedure, the patient's blood pressure, pulse, and                            oxygen saturations were monitored continuously. The                            Olympus PFC-H190DL [id number]) Colonoscope was                             introduced through the anus and advanced to the                            cecum, identified by appendiceal orifice and                            ileocecal valve. The colonoscopy was performed                            without difficulty. The patient tolerated the                            procedure well. The quality of the bowel                            preparation was good. The ileocecal valve,                            appendiceal orifice, and rectum were photographed. Scope In: 8:06:04 AM Scope Out: 8:23:04 AM Scope Withdrawal Time: 0 hours 13 minutes 0 seconds  Total Procedure Duration: 0 hours 17 minutes 0 seconds  Findings:                 The digital rectal exam was normal.                           The entire examined colon appeared normal on direct                            and retroflexion views. Complications:            No immediate complications. Estimated Blood Loss:     Estimated blood loss: none. Impression:               - The entire examined colon is normal on direct and                            retroflexion views.                           -  No specimens collected. Recommendation:           - Patient has a contact number available for                            emergencies. The signs and symptoms of potential                            delayed complications were discussed with the                            patient. Return to normal activities tomorrow.                            Written discharge instructions were provided to the                            patient.                           - Resume previous diet.                           - Continue present medications.                           - Repeat colonoscopy in 10 years for surveillance. Jerene Bears, MD 02/19/2020 8:25:56 AM This report has been signed electronically.

## 2020-02-19 NOTE — Patient Instructions (Signed)
YOU HAD AN ENDOSCOPIC PROCEDURE TODAY AT THE [facility]:   Refer to the procedure report that was given to you for any specific questions about what was found during the examination.  If the procedure report does not answer your questions, please call your gastroenterologist to clarify.  If you requested that your care partner not be given the details of your procedure findings, then the procedure report has been included in a sealed envelope for you to review at your convenience later.  YOU SHOULD EXPECT: Some feelings of bloating in the abdomen. Passage of more gas than usual.  Walking can help get rid of the air that was put into your GI tract during the procedure and reduce the bloating. If you had a lower endoscopy (such as a colonoscopy or flexible sigmoidoscopy) you may notice spotting of blood in your stool or on the toilet paper. If you underwent a bowel prep for your procedure, you may not have a normal bowel movement for a few days.  Please Note:  You might notice some irritation and congestion in your nose or some drainage.  This is from the oxygen used during your procedure.  There is no need for concern and it should clear up in a day or so.  SYMPTOMS TO REPORT IMMEDIATELY:   Following lower endoscopy (colonoscopy or flexible sigmoidoscopy):  Excessive amounts of blood in the stool  Significant tenderness or worsening of abdominal pains  Swelling of the abdomen that is new, acute  Fever of 100F or higher  For urgent or emergent issues, a gastroenterologist can be reached at any hour by calling [phone]. Do not use MyChart messaging for urgent concerns.    DIET:  We do recommend a small meal at first, but then you may proceed to your regular diet.  Drink plenty of fluids but you should avoid alcoholic beverages for 24 hours.  ACTIVITY:  You should plan to take it easy for the rest of today and you should NOT DRIVE or use heavy machinery until tomorrow (because  of the sedation medicines used during the test).    FOLLOW UP: Our staff will call the number listed on your records 48-72 hours following your procedure to check on you and address any questions or concerns that you may have regarding the information given to you following your procedure. If we do not reach you, we will leave a message.  We will attempt to reach you two times.  During this call, we will ask if you have developed any symptoms of COVID 19. If you develop any symptoms (ie: fever, flu-like symptoms, shortness of breath, cough etc.) before then, please call [phone].  If you test positive for Covid 19 in the 2 weeks post procedure, please call and report this information to us.    If any biopsies were taken you will be contacted by phone or by letter within the next 1-3 weeks.  Please call us at [phone] if you have not heard about the biopsies in 3 weeks.    SIGNATURES/CONFIDENTIALITY: You and/or your care partner have signed paperwork which will be entered into your electronic medical record.  These signatures attest to the fact that that the information above on your After Visit Summary has been reviewed and is understood.  Full responsibility of the confidentiality of this discharge information lies with you and/or your care-partner. 

## 2020-02-19 NOTE — Progress Notes (Signed)
Report given to PACU, vss 

## 2020-02-21 ENCOUNTER — Telehealth: Payer: Self-pay | Admitting: *Deleted

## 2020-02-21 NOTE — Telephone Encounter (Signed)
Attempted f/u phone call. No answer. Left message. °

## 2020-02-21 NOTE — Telephone Encounter (Signed)
LVM

## 2020-04-24 ENCOUNTER — Other Ambulatory Visit: Payer: Self-pay | Admitting: Obstetrics and Gynecology

## 2020-04-24 DIAGNOSIS — R928 Other abnormal and inconclusive findings on diagnostic imaging of breast: Secondary | ICD-10-CM

## 2020-05-14 ENCOUNTER — Ambulatory Visit: Payer: 59

## 2020-05-14 ENCOUNTER — Other Ambulatory Visit: Payer: Self-pay

## 2020-05-14 ENCOUNTER — Ambulatory Visit
Admission: RE | Admit: 2020-05-14 | Discharge: 2020-05-14 | Disposition: A | Discharge status: Home / Self Care | Payer: 59 | Source: Ambulatory Visit | Attending: Obstetrics and Gynecology | Admitting: Obstetrics and Gynecology

## 2020-05-14 DIAGNOSIS — R928 Other abnormal and inconclusive findings on diagnostic imaging of breast: Secondary | ICD-10-CM

## 2020-05-14 LAB — HM MAMMOGRAPHY

## 2020-08-15 ENCOUNTER — Other Ambulatory Visit: Payer: Self-pay

## 2020-08-15 DIAGNOSIS — B001 Herpesviral vesicular dermatitis: Secondary | ICD-10-CM

## 2020-08-15 MED ORDER — VALACYCLOVIR HCL 1 G PO TABS: ORAL_TABLET | ORAL | 1 refills | Status: DC

## 2020-12-23 ENCOUNTER — Other Ambulatory Visit: Payer: Self-pay

## 2020-12-23 ENCOUNTER — Ambulatory Visit (INDEPENDENT_AMBULATORY_CARE_PROVIDER_SITE_OTHER): Payer: 59 | Admitting: Medical-Surgical

## 2020-12-23 ENCOUNTER — Encounter: Payer: Self-pay | Admitting: Medical-Surgical

## 2020-12-23 VITALS — BP 171/109 | HR 83 | Resp 20 | Ht 66.0 in | Wt 185.1 lb

## 2020-12-23 DIAGNOSIS — J329 Chronic sinusitis, unspecified: Secondary | ICD-10-CM

## 2020-12-23 DIAGNOSIS — B001 Herpesviral vesicular dermatitis: Secondary | ICD-10-CM | POA: Diagnosis not present

## 2020-12-23 DIAGNOSIS — Z0000 Encounter for general adult medical examination without abnormal findings: Secondary | ICD-10-CM | POA: Diagnosis not present

## 2020-12-23 DIAGNOSIS — J0140 Acute pansinusitis, unspecified: Secondary | ICD-10-CM

## 2020-12-23 DIAGNOSIS — Z1329 Encounter for screening for other suspected endocrine disorder: Secondary | ICD-10-CM

## 2020-12-23 DIAGNOSIS — Z131 Encounter for screening for diabetes mellitus: Secondary | ICD-10-CM

## 2020-12-23 MED ORDER — AMOXICILLIN-POT CLAVULANATE 875-125 MG PO TABS: 1.0000 | ORAL_TABLET | Freq: Two times a day (BID) | ORAL | 0 refills | Status: DC

## 2020-12-23 MED ORDER — VALACYCLOVIR HCL 1 G PO TABS: ORAL_TABLET | ORAL | 2 refills | Status: DC

## 2020-12-23 MED ORDER — AMOXICILLIN-POT CLAVULANATE 875-125 MG PO TABS: 1.0000 | ORAL_TABLET | Freq: Two times a day (BID) | ORAL | 0 refills | Status: AC

## 2020-12-23 NOTE — Patient Instructions (Signed)

## 2020-12-23 NOTE — Progress Notes (Signed)
HPI: Susan Ramirez is a 60 y.o. female who  has a past medical history of Heart murmur, PONV (postoperative nausea and vomiting), and Postmenopausal hormone replacement therapy.  she presents to [facility] today, 12/23/20,  for chief complaint of: Annual physical exam  Dentist: upcoming appt tomorrow Eye exam: upcoming appt in Feb, no correction after Lasik Exercise: none intentional Diet: no restrictions Pap smear: Due in April (OB/GYN) Mammogram: done in 5/22 Colon cancer screening: done 2022, every 5 years COVID vaccine: done, 1 booster, next booster in April  Concerns:  Needs refill of Valtrex for prn use. Gets cold sores when lips very chapped or gets sunburned. Taking the medication at the first sign of tingling preventing outbreaks.   Past medical, surgical, social and family history reviewed:  Patient Active Problem List   Diagnosis Date Noted   Low back pain 07/20/2019   Fever blister 09/16/2018   History of colonic polyps 04/06/2017   Chronic cough 08/10/2016   DEPRESSION 05/21/2009   IRRITABLE BOWEL SYNDROME 05/21/2009   CHANGE IN BOWELS 05/21/2009    Past Surgical History:  Procedure Laterality Date   APPENDECTOMY     COLONOSCOPY  08/23/2014   [facility]   ELBOW SURGERY     EYE SURGERY     KNEE ARTHROSCOPY Bilateral    POLYPECTOMY     SHOULDER ARTHROSCOPY     SHOULDER ARTHROSCOPY WITH DISTAL CLAVICLE RESECTION Right 11/17/2012   Procedure: RIGHT SHOULDER ARTHROSCOPY WITH SUBACROMIAL DECOMPRESSION DISTAL CLAVICLE RESECTION;  Surgeon: Marin Shutter, MD;  Location: [facility];  Service: Orthopedics;  Laterality: Right;   TONSILLECTOMY      Social History   Tobacco Use   Smoking status: Never   Smokeless tobacco: Never  Substance Use Topics   Alcohol use: Yes    Comment: occ    Family History  Problem Relation Age of Onset   Skin cancer Mother    Hypertension Mother    Thyroid disease Mother    Heart attack Father     Diabetes Father    Colon cancer Neg Hx    Colon polyps Neg Hx    Esophageal cancer Neg Hx    Rectal cancer Neg Hx    Stomach cancer Neg Hx      Current medication list and allergy/intolerance information reviewed:    Current Outpatient Medications  Medication Sig Dispense Refill   estradiol-norethindrone (ACTIVELLA) 1-0.5 MG tablet Take by mouth daily.     Flaxseed, Linseed, (FLAX SEED OIL) 1300 MG CAPS Take by mouth daily.     fluorouracil (EFUDEX) 5 % cream Apply topically.     Multiple Vitamin (MULTIVITAMIN WITH MINERALS) TABS tablet Take 1 tablet by mouth daily.     amoxicillin-clavulanate (AUGMENTIN) 875-125 MG tablet Take 1 tablet by mouth 2 (two) times daily for 7 days. 14 tablet 0   valACYclovir (VALTREX) 1000 MG tablet TAKE 2 TABLETS BY MOUTH IN THE MORNING & 2TABS AT BEDTIME X 1 DAYS 20 tablet 2   No current facility-administered medications for this visit.    No Known Allergies    Review of Systems: Constitutional:  No  fever, no chills, No recent illness, No unintentional weight changes. No significant fatigue.  HEENT: + headache, no vision change, no hearing change, No sore throat, + sinus pressure Cardiac: No  chest pain, No  pressure, No palpitations, No  Orthopnea Respiratory:  No  shortness of breath. No  Cough Gastrointestinal: No  abdominal pain, No  nausea, No  vomiting,  No  blood in stool, No  diarrhea, No  constipation  Musculoskeletal: No new myalgia/arthralgia Skin: No  Rash, No other wounds/concerning lesions Genitourinary: No  incontinence, No  abnormal genital bleeding, No abnormal genital discharge Hem/Onc: No  easy bruising/bleeding, No  abnormal lymph node Endocrine: No cold intolerance,  No heat intolerance. No polyuria/polydipsia/polyphagia  Neurologic: No  weakness, No  dizziness, No  slurred speech/focal weakness/facial droop Psychiatric: No  concerns with depression, No  concerns with anxiety, No sleep problems, No mood problems  Exam:  BP  (!) 171/109    Pulse 83    Resp 20    Ht 5' 6"  (1.676 m)    Wt 185 lb 1.6 oz (84 kg)    LMP 10/17/2012    SpO2 100%    BMI 29.88 kg/m  Constitutional: VS see above. General Appearance: alert, well-developed, well-nourished, NAD Eyes: Normal lids and conjunctive, non-icteric sclera Ears, Nose, Mouth, Throat: MMM, Normal external inspection ears/nares/mouth/lips/gums. TM normal bilaterally.   Neck: No masses, trachea midline. No thyroid enlargement. No tenderness/mass appreciated. No lymphadenopathy Respiratory: Normal respiratory effort. no wheeze, no rhonchi, no rales Cardiovascular: S1/S2 normal, no murmur, no rub/gallop auscultated. RRR. No lower extremity edema. Pedal pulse II/IV bilaterally PT. No carotid bruit or JVD. No abdominal aortic bruit. Gastrointestinal: Nontender, no masses. No hepatomegaly, no splenomegaly. No hernia appreciated. Bowel sounds normal. Rectal exam deferred.  Musculoskeletal: Gait normal. No clubbing/cyanosis of digits.  Neurological: Normal balance/coordination. No tremor. No cranial nerve deficit on limited exam. Motor and sensation intact and symmetric. Cerebellar reflexes intact.  Skin: warm, dry, intact. No rash/ulcer. No concerning nevi or subq nodules on limited exam.   Psychiatric: Normal judgment/insight. Normal mood and affect. Oriented x3.    ASSESSMENT/PLAN:   1. Annual physical exam Checking CBC with differential, CMP, and lipid panel today.  Wellness information provided with AVS. - Lipid panel - COMPLETE METABOLIC PANEL WITH GFR - CBC with Differential/Platelet  2. Fever blister Refilling Valtrex for as needed use. - valACYclovir (VALTREX) 1000 MG tablet; TAKE 2 TABLETS BY MOUTH IN THE MORNING & 2TABS AT BEDTIME X 1 DAYS  Dispense: 20 tablet; Refill: 2  3. Acute non-recurrent pansinusitis Augmentin twice daily x7 days.  Continue conservative treatment.  Recommend limiting doses of Sudafed due to elevation in blood pressure. -  amoxicillin-clavulanate (AUGMENTIN) 875-125 MG tablet; Take 1 tablet by mouth 2 (two) times daily for 10 days.  Dispense: 14 tablet; Refill: 0  4. Diabetes mellitus screening A1c normal last year.  No need to rescreen at this time.  5. Thyroid disorder screen TSH normal last year.  No need to rescreen at this time.  6. Chronic congestion of paranasal sinus She already has a referral in place and establish relationship with [facility] in [place].  Unfortunately she has not been able to contact them or get a call back.  Reentering a referral to facilitate scheduling. - Ambulatory referral to ENT  Orders Placed This Encounter  Procedures   Lipid panel   COMPLETE METABOLIC PANEL WITH GFR   CBC with Differential/Platelet   Ambulatory referral to ENT    Meds ordered this encounter  Medications   valACYclovir (VALTREX) 1000 MG tablet    Sig: TAKE 2 TABLETS BY MOUTH IN THE MORNING & 2TABS AT BEDTIME X 1 DAYS    Dispense:  20 tablet    Refill:  2    Order Specific Question:   Supervising Provider    Answer:  MATTHEWS, CODY [[id number]]   DISCONTD: amoxicillin-clavulanate (AUGMENTIN) 875-125 MG tablet    Sig: Take 1 tablet by mouth 2 (two) times daily for 10 days.    Dispense:  14 tablet    Refill:  0    Order Specific Question:   Supervising Provider    Answer:   MATTHEWS, CODY [[id number]]   amoxicillin-clavulanate (AUGMENTIN) 875-125 MG tablet    Sig: Take 1 tablet by mouth 2 (two) times daily for 7 days.    Dispense:  14 tablet    Refill:  0    Order Specific Question:   Supervising Provider    Answer:   Luetta Nutting [[id number]]    Patient Instructions  Preventive Care 74-73 Years Old, Female Preventive care refers to lifestyle choices and visits with your health care provider that can promote health and wellness. Preventive care visits are also called wellness exams. What can I expect for my preventive care visit? Counseling Your health care provider may ask you questions about  your: Medical history, including: Past medical problems. Family medical history. Pregnancy history. Current health, including: Menstrual cycle. Method of birth control. Emotional well-being. Home life and relationship well-being. Sexual activity and sexual health. Lifestyle, including: Alcohol, nicotine or tobacco, and drug use. Access to firearms. Diet, exercise, and sleep habits. Work and work Statistician. Sunscreen use. Safety issues such as seatbelt and bike helmet use. Physical exam Your health care provider will check your: Height and weight. These may be used to calculate your BMI (body mass index). BMI is a measurement that tells if you are at a healthy weight. Waist circumference. This measures the distance around your waistline. This measurement also tells if you are at a healthy weight and may help predict your risk of certain diseases, such as type 2 diabetes and high blood pressure. Heart rate and blood pressure. Body temperature. Skin for abnormal spots. What immunizations do I need? Vaccines are usually given at various ages, according to a schedule. Your health care provider will recommend vaccines for you based on your age, medical history, and lifestyle or other factors, such as travel or where you work. What tests do I need? Screening Your health care provider may recommend screening tests for certain conditions. This may include: Lipid and cholesterol levels. Diabetes screening. This is done by checking your blood sugar (glucose) after you have not eaten for a while (fasting). Pelvic exam and Pap test. Hepatitis B test. Hepatitis C test. HIV (human immunodeficiency virus) test. STI (sexually transmitted infection) testing, if you are at risk. Lung cancer screening. Colorectal cancer screening. Mammogram. Talk with your health care provider about when you should start having regular mammograms. This may depend on whether you have a family history of breast  cancer. BRCA-related cancer screening. This may be done if you have a family history of breast, ovarian, tubal, or peritoneal cancers. Bone density scan. This is done to screen for osteoporosis. Talk with your health care provider about your test results, treatment options, and if necessary, the need for more tests. Follow these instructions at home: Eating and drinking  Eat a diet that includes fresh fruits and vegetables, whole grains, lean protein, and low-fat dairy products. Take vitamin and mineral supplements as recommended by your health care provider. Do not drink alcohol if: Your health care provider tells you not to drink. You are pregnant, may be pregnant, or are planning to become pregnant. If you drink alcohol: Limit how much you have to 0-1 drink a day. Know  how much alcohol is in your drink. In the [place] one drink equals one 12 oz bottle of beer (355 mL), one 5 oz glass of wine (148 mL), or one 1 oz glass of hard liquor (44 mL). Lifestyle Brush your teeth every morning and night with fluoride toothpaste. Floss one time each day. Exercise for at least 30 minutes 5 or more days each week. Do not use any products that contain nicotine or tobacco. These products include cigarettes, chewing tobacco, and vaping devices, such as e-cigarettes. If you need help quitting, ask your health care provider. Do not use drugs. If you are sexually active, practice safe sex. Use a condom or other form of protection to prevent STIs. If you do not wish to become pregnant, use a form of birth control. If you plan to become pregnant, see your health care provider for a prepregnancy visit. Take aspirin only as told by your health care provider. Make sure that you understand how much to take and what form to take. Work with your health care provider to find out whether it is safe and beneficial for you to take aspirin daily. Find healthy ways to manage stress, such as: Meditation, yoga, or listening  to music. Journaling. Talking to a trusted person. Spending time with friends and family. Minimize exposure to UV radiation to reduce your risk of skin cancer. Safety Always wear your seat belt while driving or riding in a vehicle. Do not drive: If you have been drinking alcohol. Do not ride with someone who has been drinking. When you are tired or distracted. While texting. If you have been using any mind-altering substances or drugs. Wear a helmet and other protective equipment during sports activities. If you have firearms in your house, make sure you follow all gun safety procedures. Seek help if you have been physically or sexually abused. What's next? Visit your health care provider once a year for an annual wellness visit. Ask your health care provider how often you should have your eyes and teeth checked. Stay up to date on all vaccines. This information is not intended to replace advice given to you by your health care provider. Make sure you discuss any questions you have with your health care provider. Document Revised: 06/19/2020 Document Reviewed: 06/19/2020 Elsevier Patient Education  [place].  Follow-up plan: Return in about 1 year (around 12/23/2021) for annual physical exam.  Clearnce Sorrel, DNP, APRN, FNP-BC [facility]

## 2020-12-24 LAB — COMPLETE METABOLIC PANEL WITH GFR
AG Ratio: 2 (calc) (ref 1.0–2.5)
ALT: 19 U/L (ref 6–29)
AST: 20 U/L (ref 10–35)
Albumin: 4.1 g/dL (ref 3.6–5.1)
Alkaline phosphatase (APISO): 51 U/L (ref 37–153)
BUN: 13 mg/dL (ref 7–25)
CO2: 25 mmol/L (ref 20–32)
Calcium: 9.3 mg/dL (ref 8.6–10.4)
Chloride: 104 mmol/L (ref 98–110)
Creat: 0.89 mg/dL (ref 0.50–1.05)
Globulin: 2.1 g/dL (calc) (ref 1.9–3.7)
Glucose, Bld: 96 mg/dL (ref 65–99)
Potassium: 4.7 mmol/L (ref 3.5–5.3)
Sodium: 138 mmol/L (ref 135–146)
Total Bilirubin: 0.4 mg/dL (ref 0.2–1.2)
Total Protein: 6.2 g/dL (ref 6.1–8.1)
eGFR: 74 mL/min/{1.73_m2} (ref >=60)

## 2020-12-24 LAB — CBC WITH DIFFERENTIAL/PLATELET
Absolute Monocytes: 462 cells/uL (ref 200–950)
Basophils Absolute: 33 cells/uL (ref 0–200)
Basophils Relative: 0.5 %
Eosinophils Absolute: 33 cells/uL (ref 15–500)
Eosinophils Relative: 0.5 %
HCT: 46.4 % — ABNORMAL HIGH (ref 35.0–45.0)
Hemoglobin: 15.9 g/dL — ABNORMAL HIGH (ref 11.7–15.5)
Lymphs Abs: 1859 cells/uL (ref 850–3900)
MCH: 32.4 pg (ref 27.0–33.0)
MCHC: 34.3 g/dL (ref 32.0–36.0)
MCV: 94.7 fL (ref 80.0–100.0)
MPV: 11.4 fL (ref 7.5–12.5)
Monocytes Relative: 7.1 %
Neutro Abs: 4115 cells/uL (ref 1500–7800)
Neutrophils Relative %: 63.3 %
Platelets: 292 10*3/uL (ref 140–400)
RBC: 4.9 10*6/uL (ref 3.80–5.10)
RDW: 11.5 % (ref 11.0–15.0)
Total Lymphocyte: 28.6 %
WBC: 6.5 10*3/uL (ref 3.8–10.8)

## 2020-12-24 LAB — LIPID PANEL
Cholesterol: 220 mg/dL — ABNORMAL HIGH (ref <200)
HDL: 55 mg/dL (ref >=50)
LDL Cholesterol (Calc): 131 mg/dL (calc) — ABNORMAL HIGH
Non-HDL Cholesterol (Calc): 165 mg/dL (calc) — ABNORMAL HIGH (ref <130)
Total CHOL/HDL Ratio: 4 (calc) (ref <5.0)
Triglycerides: 207 mg/dL — ABNORMAL HIGH (ref <150)

## 2021-01-02 ENCOUNTER — Other Ambulatory Visit: Payer: Self-pay

## 2021-01-02 ENCOUNTER — Ambulatory Visit (INDEPENDENT_AMBULATORY_CARE_PROVIDER_SITE_OTHER): Payer: 59 | Admitting: Medical-Surgical

## 2021-01-02 VITALS — BP 146/96 | HR 90

## 2021-01-02 DIAGNOSIS — I10 Essential (primary) hypertension: Secondary | ICD-10-CM

## 2021-01-02 MED ORDER — LISINOPRIL 10 MG PO TABS: 10.0000 mg | ORAL_TABLET | Freq: Every day | ORAL | 0 refills | Status: DC

## 2021-01-02 NOTE — Addendum Note (Signed)
Addended by: Fonnie Mu on: 01/02/2021 01:15 PM   Modules accepted: Orders

## 2021-01-02 NOTE — Progress Notes (Signed)
BP still elevated above recommended goal of 130/80 or less. Recommend starting a low dose antihypertensive medication such as Lisinopril 10mg  once daily. Check BP at home. Low sodium diet. Aim for regular exercise at least 3 times weekly. Return in 2 weeks for nurse visit for blood pressure check. If patient willing to start medication, ok to send to pharmacy of choice for 90 day supply, no refill.  ___________________________________________ Clearnce Sorrel, DNP, APRN, FNP-BC [facility] [place]

## 2021-01-02 NOTE — Progress Notes (Signed)
Pt here for nurse BP check.  Pt denies CP, SOB, headaches,or dizziness.  Pt states that she is currently not on any BP medications.  She does however state that she is still having a lot of head congestion.  She has been taking Dayquil and her last dose was yesterday morning.  She also states that she stopped Activella one week ago.   Charyl Bigger, CMA

## 2021-01-02 NOTE — Progress Notes (Signed)
Pt informed.  She is agreeable to starting medication.  RX sent to pharmacy and pt scheduled for nurse visit in 2 weeks.  Charyl Bigger, CMA

## 2021-01-03 ENCOUNTER — Ambulatory Visit: Payer: 59

## 2021-01-16 ENCOUNTER — Other Ambulatory Visit: Payer: Self-pay

## 2021-01-16 ENCOUNTER — Ambulatory Visit (INDEPENDENT_AMBULATORY_CARE_PROVIDER_SITE_OTHER): Payer: 59 | Admitting: Medical-Surgical

## 2021-01-16 VITALS — BP 133/82 | HR 109 | Ht 66.0 in | Wt 185.0 lb

## 2021-01-16 DIAGNOSIS — I10 Essential (primary) hypertension: Secondary | ICD-10-CM

## 2021-01-16 NOTE — Progress Notes (Signed)
Patient comes in today for blood pressure check.   Susan Ramirez is taking lisinopril 10mg  for blood pressure control. Pt denies any missed doses, side effects, headaches, chest pain, palpitations, dizziness, or shortness of breath.   Susan Ramirez has been checking blood pressure readings at her local pharmacy.Her last readings at the pharmacy was 133/87.  Her blood pressure reading today is: 133/82.   I spoke with Joy  who advised to continue lifestyle modications with less processed meats  and higher vegetable intake.Pt is to return to care in 3 months

## 2021-01-16 NOTE — Progress Notes (Signed)
Agree with documentation as above.  ? ?___________________________________________ ?Darby Fleeman L. Ia Leeb, DNP, APRN, FNP-BC ?Primary Care and Sports Medicine ?[facility] ? ?

## 2021-02-25 ENCOUNTER — Other Ambulatory Visit: Payer: Self-pay | Admitting: Medical-Surgical

## 2021-04-28 ENCOUNTER — Ambulatory Visit (INDEPENDENT_AMBULATORY_CARE_PROVIDER_SITE_OTHER): Payer: 59 | Admitting: Medical-Surgical

## 2021-04-28 VITALS — BP 122/77 | HR 77 | Ht 66.0 in | Wt 173.0 lb

## 2021-04-28 DIAGNOSIS — I10 Essential (primary) hypertension: Secondary | ICD-10-CM

## 2021-04-28 LAB — HM PAP SMEAR
HM Pap smear: NEGATIVE
HM Pap smear: NEGATIVE

## 2021-04-28 NOTE — Progress Notes (Signed)
Agree with documentation as above.  ? ?___________________________________________ ?Maximilien Hayashi L. Keigo Whalley, DNP, APRN, FNP-BC ?Primary Care and Sports Medicine ?[facility] ? ?

## 2021-04-28 NOTE — Progress Notes (Signed)
Patient is here for blood pressure check.  ? ?Previous BP was 133/82 ? ?1st BP today: 122/77 ? ?Denies chest pain, dizziness, shortness of breath, severe headache, or nosebleeds.  Taking medication as prescribed. Denies missed doses. ? ?Pt requested decreasing Lisinopril to 5 mg due to increased exercise (5 days) and dietary changes. ? ?Per Joy, the preference is to continue taking Lisinopril '10mg'$  due to BP meeting goal at current dosage.  ? ?Pt has been advised. She will monitor for 3 months and follow-up. ?

## 2021-06-30 ENCOUNTER — Other Ambulatory Visit: Payer: Self-pay

## 2021-06-30 MED ORDER — LISINOPRIL 10 MG PO TABS: ORAL_TABLET | ORAL | 1 refills | Status: DC

## 2021-07-03 ENCOUNTER — Encounter: Payer: Self-pay | Admitting: Physician Assistant

## 2021-07-03 ENCOUNTER — Ambulatory Visit: Payer: 59 | Admitting: Physician Assistant

## 2021-07-03 VITALS — BP 138/90 | HR 62 | Ht 65.0 in | Wt 172.0 lb

## 2021-07-03 DIAGNOSIS — C4492 Squamous cell carcinoma of skin, unspecified: Secondary | ICD-10-CM

## 2021-07-03 DIAGNOSIS — L989 Disorder of the skin and subcutaneous tissue, unspecified: Secondary | ICD-10-CM | POA: Insufficient documentation

## 2021-07-03 NOTE — Patient Instructions (Signed)
Skin Biopsy, Care After The following information offers guidance on how to care for yourself after your procedure. Your health care provider may also give you more specific instructions. If you have problems or questions, contact your health care provider. What can I expect after the procedure? After the procedure, it is common to have: Soreness or mild pain. Bruising. Itching. Some redness and swelling. Follow these instructions at home: Biopsy site care  Follow instructions from your health care provider about how to take care of your biopsy site. Make sure you: Wash your hands with soap and water for at least 20 seconds before and after you change your bandage (dressing). If soap and water are not available, use hand sanitizer. Change your dressing as told by your health care provider. Leave stitches (sutures), skin glue, or adhesive strips in place. These skin closures may need to stay in place for 2 weeks or longer. If adhesive strip edges start to loosen and curl up, you may trim the loose edges. Do not remove adhesive strips completely unless your health care provider tells you to do that. Check your biopsy site every day for signs of infection. Check for: More redness, swelling, or pain. Fluid or blood. Warmth. Pus or a bad smell. Do not take baths, swim, or use a hot tub until your health care provider approves. Ask your health care provider if you may take showers. You may only be allowed to take sponge baths. General instructions Take over-the-counter and prescription medicines only as told by your health care provider. Return to your normal activities as told by your health care provider. Ask your health care provider what activities are safe for you. Keep all follow-up visits. This is important. Contact a health care provider if: You have more redness, swelling, or pain around your biopsy site. You have fluid or blood coming from your biopsy site. Your biopsy site feels warm  to the touch. You have pus or a bad smell coming from your biopsy site. You have a fever. Your sutures, skin glue, or adhesive strips loosen or come off sooner than expected. Get help right away if: You have bleeding that does not stop with pressure or a dressing. Summary After the procedure, it is common to have soreness, bruising, and itching at the site. Follow instructions from your health care provider about how to take care of your biopsy site. Check your biopsy site every day for signs of infection. Contact a health care provider if you have more redness, swelling, or pain around your biopsy site, or your biopsy site feels warm to the touch. Keep all follow-up visits. This is important. This information is not intended to replace advice given to you by your health care provider. Make sure you discuss any questions you have with your health care provider. Document Revised: 07/23/2020 Document Reviewed: 07/23/2020 Elsevier Patient Education  2023 Elsevier Inc.  

## 2021-07-03 NOTE — Progress Notes (Signed)
   Acute Office Visit  Subjective:     Patient ID: Susan Ramirez, female    DOB: Aug 19, 1960, 61 y.o.   MRN: [id number]  Chief Complaint  Patient presents with   Follow-up    HPI Patient is in today for left upper arm mass. She has noticed this come up about 3 months ago. It seems to keep growing. At one point she did get some pus out of it. It is tender and at times bleeds a little. She could not get into her dermatologist under October and wanted to be seen sooner.   .. Active Ambulatory Problems    Diagnosis Date Noted   DEPRESSION 05/21/2009   IRRITABLE BOWEL SYNDROME 05/21/2009   CHANGE IN BOWELS 05/21/2009   Chronic cough 08/10/2016   History of colonic polyps 04/06/2017   Fever blister 09/16/2018   Low back pain 07/20/2019   Skin lesion of left arm 07/03/2021   Resolved Ambulatory Problems    Diagnosis Date Noted   No Resolved Ambulatory Problems   Past Medical History:  Diagnosis Date   Heart murmur    PONV (postoperative nausea and vomiting)    Postmenopausal hormone replacement therapy      ROS See HPI.      Objective:        Physical Exam Musculoskeletal:       Arms:      Shave Biopsy Procedure Note  Pre-operative Diagnosis: Suspicious lesion  Post-operative Diagnosis: same  Locations:see PE  Indications: r/o squamous cell carcinoma  Anesthesia: Lidocaine 1% without epinephrine without added sodium bicarbonate  Procedure Details  History of allergy to iodine: no  Patient informed of the risks (including bleeding and infection) and benefits of the  procedure and Verbal informed consent obtained.  The lesion and surrounding area were given a sterile prep using chlorhexidine and draped in the usual sterile fashion. A scalpel was used to shave an area of skin approximately 1cm by 1cm.  Hemostasis achieved with alumuninum chloride. Antibiotic ointment and a sterile dressing applied.  The specimen was sent for pathologic examination. The  patient tolerated the procedure well.  EBL: scant  Condition: Stable  Complications: none.  Plan: 1. Instructed to keep the wound dry and covered for 24-48h and clean thereafter. 2. Warning signs of infection were reviewed.   3. Recommended that the patient use OTC acetaminophen as needed for pain.       Assessment & Plan:  Marland KitchenMarland KitchenZarria was seen today for follow-up.  Diagnoses and all orders for this visit:  Skin lesion of left arm -     Pathology ([facility])   Shave biopsy done today.  Sent for pathology HO given for care and discussed to call with any signs of infection   Iran Planas, PA-C

## 2021-07-10 ENCOUNTER — Telehealth: Payer: Self-pay | Admitting: Neurology

## 2021-07-10 NOTE — Telephone Encounter (Signed)
Shelby with [employer] called and left vm wanting the location of specimen sent to them. Called her back [phone] and let her know lesion from left arm. LVM. To call back with questions.

## 2021-07-14 LAB — ANATOMIC PATHOLOGY REPORT

## 2021-07-14 LAB — SPECIMEN STATUS REPORT

## 2021-07-14 NOTE — Progress Notes (Signed)
Questionable squamous cell carcinoma. Will send to dermatology to consider taking more cells.   JJ, pt has dermatologist.

## 2021-07-15 NOTE — Addendum Note (Signed)
Addended by: Donella Stade on: 07/15/2021 03:32 PM   Modules accepted: Orders

## 2021-07-28 ENCOUNTER — Ambulatory Visit (INDEPENDENT_AMBULATORY_CARE_PROVIDER_SITE_OTHER): Payer: 59 | Admitting: Medical-Surgical

## 2021-07-28 VITALS — BP 128/82 | HR 79

## 2021-07-28 DIAGNOSIS — I10 Essential (primary) hypertension: Secondary | ICD-10-CM

## 2021-07-28 MED ORDER — LISINOPRIL 5 MG PO TABS: 5.0000 mg | ORAL_TABLET | Freq: Every day | ORAL | 1 refills | Status: DC

## 2021-07-28 NOTE — Progress Notes (Signed)
   Established Patient Office Visit  Subjective   Patient ID: Susan Ramirez, female    DOB: 1960/02/13  Age: 61 y.o. MRN: [id number]  Chief Complaint  Patient presents with   Hypertension    HPI  Susan Ramirez is here for blood pressure check. Denies chest pain, shortness of breath or dizziness.   Home blood pressure readings around 120/80.  ROS    Objective:     BP 133/81   Pulse 79   LMP 10/17/2012   SpO2 100%    Physical Exam   No results found for any visits on 07/28/21.    The 10-year ASCVD risk score (Arnett DK, et al., 2019) is: 5.7%    Assessment & Plan:  Hypertension - Blood pressure within normal limits. Advised to continue the Lisinopril 5 mg daily. Advised to follow up in 3 months with Susan Ramirez.   Problem List Items Addressed This Visit   None Visit Diagnoses     Essential hypertension    -  Primary       No follow-ups on file.    Lavell Luster, [place]

## 2021-07-28 NOTE — Progress Notes (Signed)
Agree with documentation as below.  ___________________________________________ Jesslynn Kruck L. Daysia Vandenboom, DNP, APRN, FNP-BC [facility]  

## 2021-08-20 ENCOUNTER — Telehealth: Payer: Self-pay

## 2021-08-20 DIAGNOSIS — Z87898 Personal history of other specified conditions: Secondary | ICD-10-CM

## 2021-08-20 MED ORDER — SCOPOLAMINE 1 MG/3DAYS TD PT72: 1.0000 | MEDICATED_PATCH | TRANSDERMAL | 0 refills | Status: DC

## 2021-08-20 NOTE — Telephone Encounter (Signed)
Task completed. Patient has been updated regarding the patches and provider's recommendation. No other inquiries asked during the call.

## 2021-08-20 NOTE — Telephone Encounter (Signed)
Prescription sent in. Make sure that she is aware to apply the patch behind the ear and leave in place for 3 days. Remove the patch and replace with a new one but alternate sides to allow skin to breathe and prevent irritation.  ___________________________________________ Clearnce Sorrel, DNP, APRN, FNP-BC [facility] [place]

## 2021-08-20 NOTE — Telephone Encounter (Signed)
Susan Ramirez called and left a message stating she is going on a cruise and would like sea sick patches. The cruise is 7 days. She leaves Saturday.

## 2021-08-20 NOTE — Telephone Encounter (Deleted)

## 2021-10-06 ENCOUNTER — Encounter: Payer: Self-pay | Admitting: Medical-Surgical

## 2021-10-06 MED ORDER — LISINOPRIL 5 MG PO TABS: 5.0000 mg | ORAL_TABLET | Freq: Every day | ORAL | 1 refills | Status: DC

## 2021-12-05 ENCOUNTER — Other Ambulatory Visit: Payer: Self-pay

## 2021-12-22 ENCOUNTER — Encounter: Payer: Self-pay | Admitting: Medical-Surgical

## 2021-12-22 ENCOUNTER — Ambulatory Visit (INDEPENDENT_AMBULATORY_CARE_PROVIDER_SITE_OTHER): Payer: 59 | Admitting: Medical-Surgical

## 2021-12-22 VITALS — BP 137/82 | HR 92 | Resp 20 | Ht 65.0 in | Wt 175.0 lb

## 2021-12-22 DIAGNOSIS — Z13220 Encounter for screening for lipoid disorders: Secondary | ICD-10-CM

## 2021-12-22 DIAGNOSIS — B001 Herpesviral vesicular dermatitis: Secondary | ICD-10-CM | POA: Diagnosis not present

## 2021-12-22 DIAGNOSIS — Z0000 Encounter for general adult medical examination without abnormal findings: Secondary | ICD-10-CM | POA: Diagnosis not present

## 2021-12-22 MED ORDER — VALACYCLOVIR HCL 1 G PO TABS: ORAL_TABLET | ORAL | 2 refills | Status: DC

## 2021-12-22 NOTE — Progress Notes (Signed)
Complete physical exam  Patient: Susan Ramirez   DOB: 04/04/1960   61 y.o. Female  MRN: [id number]  Subjective:    Chief Complaint  Patient presents with   Annual Exam   Susan Ramirez is a 61 y.o. female who presents today for a complete physical exam. She reports consuming a general diet. Gym/ health club routine includes cardio and light weights. She generally feels well. She reports sleeping well. She does not have additional problems to discuss today.    Most recent fall risk assessment:    12/23/2020    8:35 AM  [place] in the past year? 0  Number falls in past yr: 0  Injury with Fall? 0  Risk for fall due to : No Fall Risks  Follow up Falls evaluation completed     Most recent depression screenings:    12/22/2021    1:16 PM 12/23/2020    8:34 AM  PHQ 2/9 Scores  PHQ - 2 Score 0 0    Vision:Within last year, Dental: No current dental problems and Receives regular dental care, and STD: The patient denies history of sexually transmitted disease.    Patient Care Team: Samuel Bouche, NP as PCP - General (Nurse Practitioner)   Outpatient Medications Prior to Visit  Medication Sig   estradiol-norethindrone (ACTIVELLA) 1-0.5 MG tablet Take by mouth daily.   fluorouracil (EFUDEX) 5 % cream Apply topically.   lisinopril (ZESTRIL) 5 MG tablet Take 1 tablet (5 mg total) by mouth daily.   Multiple Vitamin (MULTIVITAMIN WITH MINERALS) TABS tablet Take 1 tablet by mouth daily.   [DISCONTINUED] scopolamine (TRANSDERM-SCOP) 1 MG/3DAYS Place 1 patch (1.5 mg total) onto the skin every 3 (three) days.   [DISCONTINUED] valACYclovir (VALTREX) 1000 MG tablet TAKE 2 TABLETS BY MOUTH IN THE MORNING & 2TABS AT BEDTIME X 1 DAYS   No facility-administered medications prior to visit.   Review of Systems  Constitutional:  Negative for chills, fever, malaise/fatigue and weight loss.  HENT:  Negative for congestion, ear pain, hearing loss, sinus pain and sore throat.   Eyes:   Negative for blurred vision, photophobia and pain.  Respiratory:  Negative for cough, shortness of breath and wheezing.   Cardiovascular:  Negative for chest pain, palpitations and leg swelling.  Gastrointestinal:  Negative for abdominal pain, constipation, diarrhea, heartburn, nausea and vomiting.  Genitourinary:  Negative for dysuria, frequency and urgency.  Musculoskeletal:  Negative for falls and neck pain.  Skin:  Negative for itching and rash.  Neurological:  Negative for dizziness, weakness and headaches.  Endo/Heme/Allergies:  Negative for polydipsia. Does not bruise/bleed easily.  Psychiatric/Behavioral:  Negative for depression, substance abuse and suicidal ideas. The patient is not nervous/anxious.      Objective:    BP 137/82 (BP Location: Left Arm, Cuff Size: Normal)   Pulse 92   Resp 20   Ht '5\' 5"'$  (1.651 m)   Wt 175 lb 0.6 oz (79.4 kg)   LMP 10/17/2012   SpO2 98%   BMI 29.13 kg/m    Physical Exam Constitutional:      General: She is not in acute distress.    Appearance: Normal appearance. She is not ill-appearing.  HENT:     Head: Normocephalic and atraumatic.     Right Ear: Tympanic membrane normal.     Left Ear: Tympanic membrane normal.     Nose: Nose normal.     Mouth/Throat:     Mouth: Mucous membranes are  moist.     Pharynx: No oropharyngeal exudate or posterior oropharyngeal erythema.  Eyes:     Extraocular Movements: Extraocular movements intact.     Conjunctiva/sclera: Conjunctivae normal.     Pupils: Pupils are equal, round, and reactive to light.  Neck:     Thyroid: No thyromegaly.     Vascular: No carotid bruit or JVD.     Trachea: Trachea normal.  Cardiovascular:     Rate and Rhythm: Normal rate and regular rhythm.     Pulses: Normal pulses.     Heart sounds: Normal heart sounds. No murmur heard.    No friction rub. No gallop.  Pulmonary:     Effort: Pulmonary effort is normal. No respiratory distress.     Breath sounds: Normal breath  sounds. No wheezing.  Abdominal:     General: Bowel sounds are normal. There is no distension.     Palpations: Abdomen is soft.     Tenderness: There is no abdominal tenderness. There is no guarding.  Musculoskeletal:        General: Normal range of motion.     Cervical back: Normal range of motion and neck supple.  Skin:    General: Skin is warm and dry.  Neurological:     Mental Status: She is alert and oriented to person, place, and time.     Cranial Nerves: No cranial nerve deficit.  Psychiatric:        Mood and Affect: Mood normal.        Behavior: Behavior normal.        Thought Content: Thought content normal.        Judgment: Judgment normal.   No results found for any visits on 12/22/21.     Assessment & Plan:    Routine Health Maintenance and Physical Exam  Immunization History  Administered Date(s) Administered   Influenza,inj,Quad PF,6+ Mos 12/22/2019   Influenza-Unspecified 12/06/2019, 11/05/2020, 11/10/2021   Moderna Sars-Covid-2 Vaccination 08/17/2019, 09/16/2019   Tdap 12/22/2019   Unspecified SARS-COV-2 Vaccination 09/29/2019    Health Maintenance  Topic Date Due   Zoster Vaccines- Shingrix (1 of 2) Never done   PAP SMEAR-Modifier  02/05/2022   COVID-19 Vaccine (4 - 2023-24 season) 01/07/2022 (Originally 09/05/2021)   MAMMOGRAM  04/29/2022 (Originally 02/05/2021)   DTaP/Tdap/Td (2 - Td or Tdap) 12/21/2029   COLONOSCOPY (Pts 45-57yr Insurance coverage will need to be confirmed)  02/18/2030   INFLUENZA VACCINE  Completed   Hepatitis C Screening  Completed   HIV Screening  Completed   HPV VACCINES  Aged Out   Discussed health benefits of physical activity, and encouraged her to engage in regular exercise appropriate for her age and condition.  1. Annual physical exam Checking labs as below.  Up-to-date on preventative care.  Wellness information provided with AVS. - Lipid panel - COMPLETE METABOLIC PANEL WITH GFR - CBC with Differential/Platelet  2.  Lipid screening Checking lipid panel today. - Lipid panel  3. Fever blister Refilling Valtrex for as needed use. - valACYclovir (VALTREX) 1000 MG tablet; TAKE 2 TABLETS BY MOUTH IN THE MORNING & 2TABS AT BEDTIME X 1 DAYS  Dispense: 20 tablet; Refill: 2  Return in about 1 year (around 12/23/2022) for annual physical exam.   JSamuel Bouche NP

## 2021-12-23 LAB — COMPLETE METABOLIC PANEL WITH GFR
AG Ratio: 2 (calc) (ref 1.0–2.5)
ALT: 15 U/L (ref 6–29)
AST: 19 U/L (ref 10–35)
Albumin: 4.6 g/dL (ref 3.6–5.1)
Alkaline phosphatase (APISO): 60 U/L (ref 37–153)
BUN: 14 mg/dL (ref 7–25)
CO2: 26 mmol/L (ref 20–32)
Calcium: 9.1 mg/dL (ref 8.6–10.4)
Chloride: 104 mmol/L (ref 98–110)
Creat: 0.85 mg/dL (ref 0.50–1.05)
Globulin: 2.3 g/dL (calc) (ref 1.9–3.7)
Glucose, Bld: 80 mg/dL (ref 65–99)
Potassium: 3.9 mmol/L (ref 3.5–5.3)
Sodium: 139 mmol/L (ref 135–146)
Total Bilirubin: 0.6 mg/dL (ref 0.2–1.2)
Total Protein: 6.9 g/dL (ref 6.1–8.1)
eGFR: 77 mL/min/{1.73_m2} (ref >=60)

## 2021-12-23 LAB — CBC WITH DIFFERENTIAL/PLATELET
Absolute Monocytes: 388 cells/uL (ref 200–950)
Basophils Absolute: 27 cells/uL (ref 0–200)
Basophils Relative: 0.4 %
Eosinophils Absolute: 27 cells/uL (ref 15–500)
Eosinophils Relative: 0.4 %
HCT: 44.3 % (ref 35.0–45.0)
Hemoglobin: 15.7 g/dL — ABNORMAL HIGH (ref 11.7–15.5)
Lymphs Abs: 2196 cells/uL (ref 850–3900)
MCH: 33.5 pg — ABNORMAL HIGH (ref 27.0–33.0)
MCHC: 35.4 g/dL (ref 32.0–36.0)
MCV: 94.7 fL (ref 80.0–100.0)
MPV: 11.6 fL (ref 7.5–12.5)
Monocytes Relative: 5.7 %
Neutro Abs: 4162 cells/uL (ref 1500–7800)
Neutrophils Relative %: 61.2 %
Platelets: 262 10*3/uL (ref 140–400)
RBC: 4.68 10*6/uL (ref 3.80–5.10)
RDW: 11.8 % (ref 11.0–15.0)
Total Lymphocyte: 32.3 %
WBC: 6.8 10*3/uL (ref 3.8–10.8)

## 2021-12-23 LAB — LIPID PANEL
Cholesterol: 214 mg/dL — ABNORMAL HIGH (ref <200)
HDL: 62 mg/dL (ref >=50)
LDL Cholesterol (Calc): 126 mg/dL (calc) — ABNORMAL HIGH
Non-HDL Cholesterol (Calc): 152 mg/dL (calc) — ABNORMAL HIGH (ref <130)
Total CHOL/HDL Ratio: 3.5 (calc) (ref <5.0)
Triglycerides: 147 mg/dL (ref <150)

## 2021-12-24 ENCOUNTER — Encounter: Payer: 59 | Admitting: Medical-Surgical

## 2022-02-08 ENCOUNTER — Other Ambulatory Visit: Payer: Self-pay | Admitting: Medical-Surgical

## 2022-02-26 ENCOUNTER — Encounter: Payer: Self-pay | Admitting: Medical-Surgical

## 2022-03-04 ENCOUNTER — Telehealth: Payer: Self-pay | Admitting: Medical-Surgical

## 2022-03-04 DIAGNOSIS — I10 Essential (primary) hypertension: Secondary | ICD-10-CM

## 2022-03-04 MED ORDER — LISINOPRIL 5 MG PO TABS: 5.0000 mg | ORAL_TABLET | Freq: Every day | ORAL | 1 refills | Status: DC

## 2022-03-04 NOTE — Telephone Encounter (Signed)
Also patient request refills on lisinopril '5mg'$  she changed her pharmacy to [facility] [address] phone number is [phone]

## 2022-03-04 NOTE — Telephone Encounter (Signed)
Good Morning, Patient is requesting to get 1st shingles vaccine

## 2022-03-04 NOTE — Telephone Encounter (Signed)
Refill of Lisinopril '5mg'$  daily sent to [facility] as requested.   Ok to schedule for a NV to get her 1st shingles vaccine.   ___________________________________________ Clearnce Sorrel, DNP, APRN, FNP-BC Primary Care and Sports Medicine [place]

## 2022-03-04 NOTE — Addendum Note (Signed)
Addended bySamuel Bouche on: 03/04/2022 12:45 PM   Modules accepted: Orders

## 2022-03-06 ENCOUNTER — Encounter: Payer: Self-pay | Admitting: Medical-Surgical

## 2022-03-09 ENCOUNTER — Encounter: Payer: Self-pay | Admitting: Medical-Surgical

## 2022-03-09 ENCOUNTER — Ambulatory Visit (INDEPENDENT_AMBULATORY_CARE_PROVIDER_SITE_OTHER): Admitting: Medical-Surgical

## 2022-03-09 VITALS — Temp 98.4°F

## 2022-03-09 DIAGNOSIS — Z23 Encounter for immunization: Secondary | ICD-10-CM | POA: Diagnosis not present

## 2022-03-09 DIAGNOSIS — I10 Essential (primary) hypertension: Secondary | ICD-10-CM

## 2022-03-09 MED ORDER — LISINOPRIL 5 MG PO TABS: 5.0000 mg | ORAL_TABLET | Freq: Every day | ORAL | 1 refills | Status: DC

## 2022-03-09 NOTE — Progress Notes (Signed)
   Established Patient Office Visit  Subjective   Patient ID: Susan Ramirez, female    DOB: 1960/07/16  Age: 62 y.o. MRN: [id number]  Chief Complaint  Patient presents with   Immunizations    HPI  Susan Ramirez is here for shingles vaccine.   ROS    Objective:     Temp 98.4 F (36.9 C) (Temporal)   LMP 10/17/2012    Physical Exam   No results found for any visits on 03/09/22.    The 10-year ASCVD risk score (Arnett DK, et al., 2019) is: 6.1%    Assessment & Plan:  Shingles vaccine - Patient tolerated injection well without complications. Patient advised to schedule next injection 2-6 months from today.   Problem List Items Addressed This Visit   None Visit Diagnoses     Need for shingles vaccine    -  Primary   Relevant Orders   Varicella-zoster vaccine IM (Completed)   Essential hypertension       Relevant Medications   lisinopril (ZESTRIL) 5 MG tablet       Return in about 2 months (around 05/09/2022) for 2 nd shingles vaccine. Durene Romans, Monico Blitz, [place]

## 2022-03-09 NOTE — Progress Notes (Signed)
Agree with documentation as below.  ___________________________________________ Fareeha Evon L. Ala Capri, DNP, APRN, FNP-BC Primary Care and Sports Medicine [facility]  

## 2022-03-10 ENCOUNTER — Other Ambulatory Visit: Payer: Self-pay | Admitting: Medical-Surgical

## 2022-03-10 MED ORDER — LISINOPRIL 5 MG PO TABS: 5.0000 mg | ORAL_TABLET | Freq: Every day | ORAL | 3 refills | Status: DC

## 2022-05-11 DIAGNOSIS — I10 Essential (primary) hypertension: Secondary | ICD-10-CM | POA: Insufficient documentation

## 2022-07-03 ENCOUNTER — Ambulatory Visit (INDEPENDENT_AMBULATORY_CARE_PROVIDER_SITE_OTHER): Admitting: Medical-Surgical

## 2022-07-03 VITALS — BP 129/88 | HR 75 | Ht 65.0 in | Wt 173.0 lb

## 2022-07-03 DIAGNOSIS — Z23 Encounter for immunization: Secondary | ICD-10-CM | POA: Diagnosis not present

## 2022-07-03 NOTE — Progress Notes (Addendum)
Medical screening examination/treatment was performed by qualified clinical staff member and as supervising provider I was immediately available for consultation/collaboration. I have reviewed documentation and agree with assessment and plan. ° °Miyu Fenderson L. Rockey Guarino, DNP, APRN, FNP-BC °[facility] °Primary Care and Sports Medicine ° °

## 2022-08-12 ENCOUNTER — Encounter: Payer: Self-pay | Admitting: Medical-Surgical

## 2022-09-25 ENCOUNTER — Telehealth: Payer: Self-pay | Admitting: Family Medicine

## 2022-09-25 NOTE — Telephone Encounter (Signed)
Please call patient and remind her to schedule her mammogram I see that she has upcoming surgery in October.  Since she will not be able to get a mammogram for a while I would recommend that she go ahead and get that done before her surgery.

## 2022-10-01 ENCOUNTER — Encounter: Payer: Self-pay | Admitting: Medical-Surgical

## 2022-10-01 NOTE — Telephone Encounter (Signed)
Records request sent.

## 2022-10-07 NOTE — Telephone Encounter (Signed)
Message read by patient on 10/01/2022

## 2022-10-14 NOTE — H&P (Signed)
  Subjective Patient ID: Susan Ramirez is a 62 y.o. female.     HPI   Returns for follow up discussion breast reduction. Current 38DDD. Reports several year history neck shoulder and back pain, rashes between breasts. This has not improved despite 3 month trial specialty fitted bras, OTC pain mediation, hot/cold packs, and weight loss as below, hygiene measures, topical antifungals.    Wt- lost weight over last year to lowest 166 lb.    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 Lives with spouse.   Review of Systems  Musculoskeletal:  Positive for back pain and neck pain.  Skin:  Positive for rash.  All other systems reviewed and are negative.         Objective Physical Exam  Cardiovascular: Normal rate, regular rhythm and normal heart sounds.    Pulmonary/Chest Effort normal and breath sounds normal.    Skin   Fitzpatrick 1    Lymph: no axillary adenopathy   +shoulder grooving Breasts: grade 3 ptosis bilateral no masses Left> right volume Sn to nipple R 35 L 35 cm BW R 23 L 23 cm Nipple to IMF R 11 L 12 cm       Assessment/Plan Macromastia Chronic neck and back pain     Chronic neck and back pain, intertrigo that has failed conservative measures in setting of macromastia. No other cause of back pain or rashes noted. There is a reasonable likelihood that the symptoms are primarily due to macromastia; breast reduction surgery has reasonable expectation to improve symptoms.   Reviewed reduction with anchor type scars, OP surgery, drains, post operative visits and limitations, recovery. Diminished sensation nipple and breast skin, risk of nipple loss, wound healing problems, asymmetry, incidental carcinoma, changes with wt gain/loss, aging, unacceptable cosmetic appearance reviewed. Cannot assure her cup size.   Additional risks including but not limited to bleeding, hematoma, seroma, need for  additional procedures, damage to adjacent structures, infection, blood clots in legs or lungs reviewed. Completed ASPS consent breast reduction.    Drain teaching completed. Rx for tramadol given.   Anticipate 527 g reduction from each breast.     Glenna Fellows, MD [facility]astic [facility] Re[facility] Surgery  Office/ physician access line after hours [phone]

## 2022-10-15 ENCOUNTER — Encounter: Payer: Self-pay | Admitting: Medical-Surgical

## 2022-10-19 ENCOUNTER — Encounter (HOSPITAL_BASED_OUTPATIENT_CLINIC_OR_DEPARTMENT_OTHER): Payer: Self-pay | Admitting: Plastic Surgery

## 2022-10-22 ENCOUNTER — Encounter (HOSPITAL_BASED_OUTPATIENT_CLINIC_OR_DEPARTMENT_OTHER)
Admission: RE | Admit: 2022-10-22 | Discharge: 2022-10-22 | Disposition: A | Discharge status: Home / Self Care | Source: Ambulatory Visit | Attending: Plastic Surgery | Admitting: Plastic Surgery

## 2022-10-22 DIAGNOSIS — I10 Essential (primary) hypertension: Secondary | ICD-10-CM | POA: Insufficient documentation

## 2022-10-22 DIAGNOSIS — Z01810 Encounter for preprocedural cardiovascular examination: Secondary | ICD-10-CM | POA: Insufficient documentation

## 2022-10-22 DIAGNOSIS — Z01818 Encounter for other preprocedural examination: Secondary | ICD-10-CM | POA: Diagnosis present

## 2022-10-22 DIAGNOSIS — R9431 Abnormal electrocardiogram [ECG] [EKG]: Secondary | ICD-10-CM | POA: Diagnosis not present

## 2022-10-22 MED ORDER — CHLORHEXIDINE GLUCONATE CLOTH 2 % EX PADS: 6.0000 | MEDICATED_PAD | Freq: Once | CUTANEOUS | Status: DC

## 2022-10-22 NOTE — Progress Notes (Signed)

## 2022-10-26 NOTE — Plan of Care (Signed)
[facility] Tonsillectomy/Adenoidectomy, Postoperative PEDS care plan entered in error.

## 2022-10-27 ENCOUNTER — Encounter (HOSPITAL_BASED_OUTPATIENT_CLINIC_OR_DEPARTMENT_OTHER): Payer: Self-pay | Admitting: Plastic Surgery

## 2022-10-27 ENCOUNTER — Other Ambulatory Visit: Payer: Self-pay

## 2022-10-27 ENCOUNTER — Ambulatory Visit (HOSPITAL_BASED_OUTPATIENT_CLINIC_OR_DEPARTMENT_OTHER)
Admission: RE | Admit: 2022-10-27 | Discharge: 2022-10-27 | Disposition: A | Discharge status: Home / Self Care | Attending: Plastic Surgery | Admitting: Plastic Surgery

## 2022-10-27 ENCOUNTER — Ambulatory Visit (HOSPITAL_BASED_OUTPATIENT_CLINIC_OR_DEPARTMENT_OTHER): Admitting: Anesthesiology

## 2022-10-27 ENCOUNTER — Encounter (HOSPITAL_BASED_OUTPATIENT_CLINIC_OR_DEPARTMENT_OTHER)
Admission: RE | Disposition: A | Discharge status: Home / Self Care | Payer: Self-pay | Source: Home / Self Care | Attending: Plastic Surgery

## 2022-10-27 DIAGNOSIS — N6022 Fibroadenosis of left breast: Secondary | ICD-10-CM | POA: Insufficient documentation

## 2022-10-27 DIAGNOSIS — I10 Essential (primary) hypertension: Secondary | ICD-10-CM | POA: Insufficient documentation

## 2022-10-27 DIAGNOSIS — N62 Hypertrophy of breast: Secondary | ICD-10-CM | POA: Insufficient documentation

## 2022-10-27 DIAGNOSIS — L304 Erythema intertrigo: Secondary | ICD-10-CM | POA: Diagnosis not present

## 2022-10-27 HISTORY — DX: Essential (primary) hypertension: I10

## 2022-10-27 HISTORY — PX: BREAST REDUCTION SURGERY: SHX8

## 2022-10-27 SURGERY — MAMMOPLASTY, REDUCTION
Anesthesia: General | Site: Breast | Laterality: Bilateral

## 2022-10-27 MED ORDER — OXYCODONE HCL 5 MG PO TABS
ORAL_TABLET | ORAL | Status: AC
  Filled 2022-10-27: qty 1

## 2022-10-27 MED ORDER — LACTATED RINGERS IV SOLN: INTRAVENOUS | Status: DC

## 2022-10-27 MED ORDER — HYDROMORPHONE HCL 1 MG/ML IJ SOLN
INTRAMUSCULAR | Status: AC
  Filled 2022-10-27: qty 0.5

## 2022-10-27 MED ORDER — ACETAMINOPHEN 500 MG PO TABS
ORAL_TABLET | ORAL | Status: AC
  Filled 2022-10-27: qty 2

## 2022-10-27 MED ORDER — SUGAMMADEX SODIUM 200 MG/2ML IV SOLN
INTRAVENOUS | Status: DC | PRN
  Administered 2022-10-27: 200 mg via INTRAVENOUS

## 2022-10-27 MED ORDER — DROPERIDOL 2.5 MG/ML IJ SOLN
INTRAMUSCULAR | Status: DC | PRN
  Administered 2022-10-27: .625 mg via INTRAVENOUS

## 2022-10-27 MED ORDER — ALBUMIN HUMAN 5 % IV SOLN: INTRAVENOUS | Status: DC | PRN

## 2022-10-27 MED ORDER — ACETAMINOPHEN 500 MG PO TABS
1000.0000 mg | ORAL_TABLET | ORAL | Status: AC
  Administered 2022-10-27: 1000 mg via ORAL

## 2022-10-27 MED ORDER — CEFAZOLIN SODIUM-DEXTROSE 2-4 GM/100ML-% IV SOLN
2.0000 g | INTRAVENOUS | Status: AC
  Administered 2022-10-27: 2 g via INTRAVENOUS

## 2022-10-27 MED ORDER — ROCURONIUM BROMIDE 10 MG/ML (PF) SYRINGE
PREFILLED_SYRINGE | INTRAVENOUS | Status: AC
  Filled 2022-10-27: qty 10

## 2022-10-27 MED ORDER — PHENYLEPHRINE 80 MCG/ML (10ML) SYRINGE FOR IV PUSH (FOR BLOOD PRESSURE SUPPORT)
PREFILLED_SYRINGE | INTRAVENOUS | Status: AC
  Filled 2022-10-27: qty 10

## 2022-10-27 MED ORDER — MEPERIDINE HCL 25 MG/ML IJ SOLN: 6.2500 mg | INTRAMUSCULAR | Status: DC | PRN

## 2022-10-27 MED ORDER — OXYCODONE HCL 5 MG/5ML PO SOLN: 5.0000 mg | Freq: Once | ORAL | Status: AC | PRN

## 2022-10-27 MED ORDER — HYDROMORPHONE HCL 1 MG/ML IJ SOLN
0.2500 mg | INTRAMUSCULAR | Status: DC | PRN
  Administered 2022-10-27 (×2): 0.5 mg via INTRAVENOUS

## 2022-10-27 MED ORDER — ONDANSETRON HCL 4 MG/2ML IJ SOLN
INTRAMUSCULAR | Status: DC | PRN
  Administered 2022-10-27: 4 mg via INTRAVENOUS

## 2022-10-27 MED ORDER — CEFAZOLIN SODIUM-DEXTROSE 2-4 GM/100ML-% IV SOLN
INTRAVENOUS | Status: AC
  Filled 2022-10-27: qty 100

## 2022-10-27 MED ORDER — GABAPENTIN 300 MG PO CAPS
300.0000 mg | ORAL_CAPSULE | ORAL | Status: AC
  Administered 2022-10-27: 300 mg via ORAL

## 2022-10-27 MED ORDER — OXYCODONE HCL 5 MG PO TABS
5.0000 mg | ORAL_TABLET | Freq: Once | ORAL | Status: AC | PRN
  Administered 2022-10-27: 5 mg via ORAL

## 2022-10-27 MED ORDER — SUCCINYLCHOLINE CHLORIDE 200 MG/10ML IV SOSY
PREFILLED_SYRINGE | INTRAVENOUS | Status: AC
  Filled 2022-10-27: qty 10

## 2022-10-27 MED ORDER — DEXAMETHASONE SODIUM PHOSPHATE 10 MG/ML IJ SOLN
INTRAMUSCULAR | Status: AC
  Filled 2022-10-27: qty 1

## 2022-10-27 MED ORDER — DEXAMETHASONE SODIUM PHOSPHATE 4 MG/ML IJ SOLN
INTRAMUSCULAR | Status: DC | PRN
  Administered 2022-10-27: 8 mg via INTRAVENOUS

## 2022-10-27 MED ORDER — BUPIVACAINE HCL (PF) 0.5 % IJ SOLN
INTRAMUSCULAR | Status: AC
  Filled 2022-10-27: qty 60

## 2022-10-27 MED ORDER — DROPERIDOL 2.5 MG/ML IJ SOLN
INTRAMUSCULAR | Status: AC
  Filled 2022-10-27: qty 2

## 2022-10-27 MED ORDER — FENTANYL CITRATE (PF) 100 MCG/2ML IJ SOLN
INTRAMUSCULAR | Status: DC | PRN
  Administered 2022-10-27 (×4): 50 ug via INTRAVENOUS

## 2022-10-27 MED ORDER — 0.9 % SODIUM CHLORIDE (POUR BTL) OPTIME
TOPICAL | Status: DC | PRN
  Administered 2022-10-27: 200 mL

## 2022-10-27 MED ORDER — LIDOCAINE 2% (20 MG/ML) 5 ML SYRINGE
INTRAMUSCULAR | Status: AC
  Filled 2022-10-27: qty 5

## 2022-10-27 MED ORDER — BUPIVACAINE HCL (PF) 0.5 % IJ SOLN
INTRAMUSCULAR | Status: DC | PRN
  Administered 2022-10-27: 30 mL

## 2022-10-27 MED ORDER — ROCURONIUM BROMIDE 100 MG/10ML IV SOLN
INTRAVENOUS | Status: DC | PRN
  Administered 2022-10-27: 80 mg via INTRAVENOUS

## 2022-10-27 MED ORDER — FENTANYL CITRATE (PF) 100 MCG/2ML IJ SOLN
INTRAMUSCULAR | Status: AC
  Filled 2022-10-27: qty 2

## 2022-10-27 MED ORDER — ATROPINE SULFATE 0.4 MG/ML IV SOLN
INTRAVENOUS | Status: AC
  Filled 2022-10-27: qty 1

## 2022-10-27 MED ORDER — PROPOFOL 10 MG/ML IV BOLUS
INTRAVENOUS | Status: DC | PRN
  Administered 2022-10-27: 200 mg via INTRAVENOUS

## 2022-10-27 MED ORDER — EPHEDRINE 5 MG/ML INJ
INTRAVENOUS | Status: AC
  Filled 2022-10-27: qty 5

## 2022-10-27 MED ORDER — SODIUM CHLORIDE 0.9 % IV SOLN: 12.5000 mg | INTRAVENOUS | Status: DC | PRN

## 2022-10-27 MED ORDER — GABAPENTIN 300 MG PO CAPS
ORAL_CAPSULE | ORAL | Status: AC
  Filled 2022-10-27: qty 1

## 2022-10-27 MED ORDER — CELECOXIB 200 MG PO CAPS
200.0000 mg | ORAL_CAPSULE | ORAL | Status: AC
  Administered 2022-10-27: 200 mg via ORAL

## 2022-10-27 MED ORDER — MIDAZOLAM HCL 5 MG/5ML IJ SOLN
INTRAMUSCULAR | Status: DC | PRN
  Administered 2022-10-27: 2 mg via INTRAVENOUS

## 2022-10-27 MED ORDER — MIDAZOLAM HCL 2 MG/2ML IJ SOLN
INTRAMUSCULAR | Status: AC
  Filled 2022-10-27: qty 2

## 2022-10-27 MED ORDER — ONDANSETRON HCL 4 MG/2ML IJ SOLN
INTRAMUSCULAR | Status: AC
  Filled 2022-10-27: qty 2

## 2022-10-27 MED ORDER — HYDROMORPHONE HCL 1 MG/ML IJ SOLN
INTRAMUSCULAR | Status: DC | PRN
  Administered 2022-10-27: .5 mg via INTRAVENOUS

## 2022-10-27 MED ORDER — ALBUMIN HUMAN 5 % IV SOLN
INTRAVENOUS | Status: AC
  Filled 2022-10-27: qty 250

## 2022-10-27 MED ORDER — LIDOCAINE HCL (CARDIAC) PF 100 MG/5ML IV SOSY
PREFILLED_SYRINGE | INTRAVENOUS | Status: DC | PRN
  Administered 2022-10-27: 60 mg via INTRAVENOUS

## 2022-10-27 MED ORDER — CELECOXIB 200 MG PO CAPS
ORAL_CAPSULE | ORAL | Status: AC
  Filled 2022-10-27: qty 1

## 2022-10-27 MED ORDER — PHENYLEPHRINE HCL (PRESSORS) 10 MG/ML IV SOLN
INTRAVENOUS | Status: DC | PRN
  Administered 2022-10-27 (×4): 80 ug via INTRAVENOUS

## 2022-10-27 SURGICAL SUPPLY — 52 items
ADH SKN CLS APL DERMABOND .7 (GAUZE/BANDAGES/DRESSINGS) ×2
APL PRP STRL LF DISP 70% ISPRP (MISCELLANEOUS) ×2
BINDER BREAST 3XL (GAUZE/BANDAGES/DRESSINGS) IMPLANT
BINDER BREAST LRG (GAUZE/BANDAGES/DRESSINGS) IMPLANT
BINDER BREAST MEDIUM (GAUZE/BANDAGES/DRESSINGS) IMPLANT
BINDER BREAST XLRG (GAUZE/BANDAGES/DRESSINGS) IMPLANT
BINDER BREAST XXLRG (GAUZE/BANDAGES/DRESSINGS) IMPLANT
BLADE SURG 10 STRL SS (BLADE) ×4 IMPLANT
BNDG GAUZE DERMACEA FLUFF 4 (GAUZE/BANDAGES/DRESSINGS) ×2 IMPLANT
BNDG GZE DERMACEA 4 6PLY (GAUZE/BANDAGES/DRESSINGS) ×2
CANISTER SUCT 1200ML W/VALVE (MISCELLANEOUS) ×1 IMPLANT
CHLORAPREP W/TINT 26 (MISCELLANEOUS) ×2 IMPLANT
COVER BACK TABLE 60X90IN (DRAPES) ×1 IMPLANT
COVER MAYO STAND STRL (DRAPES) ×1 IMPLANT
DERMABOND ADVANCED .7 DNX12 (GAUZE/BANDAGES/DRESSINGS) ×2 IMPLANT
DRAIN CHANNEL 15F RND FF W/TCR (WOUND CARE) IMPLANT
DRAPE TOP ARMCOVERS (MISCELLANEOUS) ×1 IMPLANT
DRAPE U-SHAPE 76X120 STRL (DRAPES) ×1 IMPLANT
DRAPE UTILITY XL STRL (DRAPES) ×1 IMPLANT
ELECT COATED BLADE 2.86 ST (ELECTRODE) ×1 IMPLANT
ELECT REM PT RETURN 9FT ADLT (ELECTROSURGICAL) ×1
ELECTRODE REM PT RTRN 9FT ADLT (ELECTROSURGICAL) ×1 IMPLANT
EVACUATOR SILICONE 100CC (DRAIN) IMPLANT
GAUZE PAD ABD 8X10 STRL (GAUZE/BANDAGES/DRESSINGS) ×2 IMPLANT
GLOVE BIO SURGEON STRL SZ 6 (GLOVE) ×2 IMPLANT
GLOVE BIO SURGEON STRL SZ8 (GLOVE) IMPLANT
GLOVE BIOGEL PI IND STRL 8 (GLOVE) IMPLANT
GOWN STRL REUS W/ TWL LRG LVL3 (GOWN DISPOSABLE) ×2 IMPLANT
GOWN STRL REUS W/TWL LRG LVL3 (GOWN DISPOSABLE) ×1
GOWN STRL REUS W/TWL XL LVL3 (GOWN DISPOSABLE) IMPLANT
MARKER SKIN DUAL TIP RULER LAB (MISCELLANEOUS) IMPLANT
NDL HYPO 25X1 1.5 SAFETY (NEEDLE) ×1 IMPLANT
NEEDLE HYPO 25X1 1.5 SAFETY (NEEDLE) ×1
NS IRRIG 1000ML POUR BTL (IV SOLUTION) ×1 IMPLANT
PACK BASIN DAY SURGERY FS (CUSTOM PROCEDURE TRAY) ×1 IMPLANT
PENCIL SMOKE EVACUATOR (MISCELLANEOUS) ×1 IMPLANT
PIN SAFETY STERILE (MISCELLANEOUS) ×1 IMPLANT
SHEET MEDIUM DRAPE 40X70 STRL (DRAPES) ×1 IMPLANT
SLEEVE SCD COMPRESS KNEE MED (STOCKING) ×1 IMPLANT
SPONGE T-LAP 18X18 ~~LOC~~+RFID (SPONGE) ×3 IMPLANT
STAPLER VISISTAT 35W (STAPLE) ×1 IMPLANT
SUT ETHILON 2 0 FS 18 (SUTURE) IMPLANT
SUT MNCRL AB 4-0 PS2 18 (SUTURE) IMPLANT
SUT VIC AB 3-0 PS1 18 (SUTURE) ×9
SUT VIC AB 3-0 PS1 18XBRD (SUTURE) IMPLANT
SUT VIC AB 4-0 PS2 18 (SUTURE) IMPLANT
SYR BULB IRRIG 60ML STRL (SYRINGE) ×1 IMPLANT
SYR CONTROL 10ML LL (SYRINGE) ×1 IMPLANT
TOWEL GREEN STERILE FF (TOWEL DISPOSABLE) ×2 IMPLANT
TUBE CONNECTING 20X1/4 (TUBING) ×1 IMPLANT
UNDERPAD 30X36 HEAVY ABSORB (UNDERPADS AND DIAPERS) ×2 IMPLANT
YANKAUER SUCT BULB TIP NO VENT (SUCTIONS) ×1 IMPLANT

## 2022-10-27 NOTE — Transfer of Care (Signed)
Immediate Anesthesia Transfer of Care Note  Patient: Susan Ramirez  Procedure(s) Performed: MAMMARY REDUCTION  (BREAST) (Bilateral: Breast)  Patient Location: PACU  Anesthesia Type:General  Level of Consciousness: sedated  Airway & Oxygen Therapy: Patient Spontanous Breathing and Patient connected to face mask oxygen  Post-op Assessment: Report given to RN and Post -op Vital signs reviewed and stable  Post vital signs: Reviewed and stable  Last Vitals:  Vitals Value Taken Time  BP    Temp    Pulse 96 10/27/22 1057  Resp    SpO2 99 % 10/27/22 1057  Vitals shown include unfiled device data.  Last Pain:  Vitals:   10/27/22 0623  TempSrc: Temporal  PainSc: 0-No pain         Complications: No notable events documented.

## 2022-10-27 NOTE — Discharge Instructions (Addendum)
Post Anesthesia Home Care Instructions  Activity: Get plenty of rest for the remainder of the day. A responsible individual must stay with you for 24 hours following the procedure.  For the next 24 hours, DO NOT: -Drive a car -Advertising copywriter -Drink alcoholic beverages -Take any medication unless instructed by your physician -Make any legal decisions or sign important papers.  Meals: Start with liquid foods such as gelatin or soup. Progress to regular foods as tolerated. Avoid greasy, spicy, heavy foods. If nausea and/or vomiting occur, drink only clear liquids until the nausea and/or vomiting subsides. Call your physician if vomiting continues.  Special Instructions/Symptoms: Your throat may feel dry or sore from the anesthesia or the breathing tube placed in your throat during surgery. If this causes discomfort, gargle with warm salt water. The discomfort should disappear within 24 hours.  If you had a scopolamine patch placed behind your ear for the management of post- operative nausea and/or vomiting:  1. The medication in the patch is effective for 72 hours, after which it should be removed.  Wrap patch in a tissue and discard in the trash. Wash hands thoroughly with soap and water. 2. You may remove the patch earlier than 72 hours if you experience unpleasant side effects which may include dry mouth, dizziness or visual disturbances. 3. Avoid touching the patch. Wash your hands with soap and water after contact with the patch.     May have Tylenol today after 12:30 PM May have Ibuprofen today after 12:30 PM   About my Jackson-Pratt Bulb Drain  What is a Jackson-Pratt bulb? A Jackson-Pratt is a soft, round device used to collect drainage. It is connected to a long, thin drainage catheter, which is held in place by one or two small stiches near your surgical incision site. When the bulb is squeezed, it forms a vacuum, forcing the drainage to empty into the bulb.  Emptying the  Jackson-Pratt bulb- To empty the bulb: 1. Release the plug on the top of the bulb. 2. Pour the bulb's contents into a measuring container which your nurse will provide. 3. Record the time emptied and amount of drainage. Empty the drain(s) as often as your     doctor or nurse recommends.  Date                  Time                    Amount (Drain 1)                 Amount (Drain 2)  _____________________________________________________________________  _____________________________________________________________________  _____________________________________________________________________  _____________________________________________________________________  _____________________________________________________________________  _____________________________________________________________________  _____________________________________________________________________  _____________________________________________________________________  Squeezing the Jackson-Pratt Bulb- To squeeze the bulb: 1. Make sure the plug at the top of the bulb is open. 2. Squeeze the bulb tightly in your fist. You will hear air squeezing from the bulb. 3. Replace the plug while the bulb is squeezed. 4. Use a safety pin to attach the bulb to your clothing. This will keep the catheter from     pulling at the bulb insertion site.  When to call your doctor- Call your doctor if: Drain site becomes red, swollen or hot. You have a fever greater than 101 degrees F. There is oozing at the drain site. Drain falls out (apply a guaze bandage over the drain hole and secure it with tape). Drainage increases daily not related to activity patterns. (You will usually have more drainage when  you are active than when you are resting.) Drainage has a bad odor.   Oxycodone 5 mg given at 12:00 PM today

## 2022-10-27 NOTE — Anesthesia Postprocedure Evaluation (Signed)
Anesthesia Post Note  Patient: Susan Ramirez  Procedure(s) Performed: MAMMARY REDUCTION  (BREAST) (Bilateral: Breast)     Patient location during evaluation: PACU Anesthesia Type: General Level of consciousness: awake and alert Pain management: pain level controlled Vital Signs Assessment: post-procedure vital signs reviewed and stable Respiratory status: spontaneous breathing, nonlabored ventilation and respiratory function stable Cardiovascular status: blood pressure returned to baseline and stable Postop Assessment: no apparent nausea or vomiting Anesthetic complications: no   No notable events documented.  Last Vitals:  Vitals:   10/27/22 1145 10/27/22 1200  BP: 124/74 (!) 142/85  Pulse: 88 94  Resp: 10 16  Temp:  (!) 36.4 C  SpO2: 99% 95%    Last Pain:  Vitals:   10/27/22 1200  TempSrc:   PainSc: 3                  Lowella Curb

## 2022-10-27 NOTE — Anesthesia Procedure Notes (Signed)
Procedure Name: Intubation Date/Time: 10/27/2022 7:31 AM  Performed by: Ronnette Hila, CRNAPre-anesthesia Checklist: Patient identified, Emergency Drugs available, Suction available and Patient being monitored Patient Re-evaluated:Patient Re-evaluated prior to induction Oxygen Delivery Method: Circle system utilized Preoxygenation: Pre-oxygenation with 100% oxygen Induction Type: IV induction Ventilation: Mask ventilation without difficulty Laryngoscope Size: Mac and 3 Grade View: Grade I Tube type: Oral Tube size: 7.0 mm Number of attempts: 1 Airway Equipment and Method: Stylet and Oral airway Placement Confirmation: ETT inserted through vocal cords under direct vision, positive ETCO2 and breath sounds checked- equal and bilateral Secured at: 22 cm Tube secured with: Tape Dental Injury: Teeth and Oropharynx as per pre-operative assessment

## 2022-10-27 NOTE — Op Note (Addendum)
Operative Note   DATE OF OPERATION: 10.22.2024  LOCATION: [facility]-outpatient  SURGICAL DIVISION: Plastic Surgery  PREOPERATIVE DIAGNOSES:  1. Macromastia 2. Chronic neck and back pain   POSTOPERATIVE DIAGNOSES:  same  PROCEDURE:  Bilateral breast reduction  SURGEON: Glenna Fellows MD MBA  ASSISTANT: none  ANESTHESIA:  General.   EBL: 50 ml  COMPLICATIONS: None immediate.   INDICATIONS FOR PROCEDURE:  The patient, [name], is a 62 y.o. female born on 1960/05/04, is here for treatment chronic neck and back pain insetting macromastia that has failed conservative measures.   FINDINGS: Right reduction 731 g Left reduction 723 g   DESCRIPTION OF PROCEDURE:  The patient's operative site was marked with the patient in the preoperative area. The patient was The patient was marked standing in the preoperative area to mark sternal notch, chest midline, anterior axillary lines, inframammary folds. The location of new nipple areolar complex was marked at level of on inframammary fold on anterior surface breast by palpation. This was marked symmetric over bilateral breasts. With aid of Wise pattern marker, location of new nipple areolar complex and vertical limbs (7.5 cm) were marked by displacement of breasts along meridian. The patient was taken to the operating room. SCDs were placed and IV antibiotics were given. The patient's operative site was prepped and draped in a sterile fashion. A time out was performed and all information was confirmed to be correct.     I began on left breast. Over left breast, superior medial pedicle marked and nipple areolar complex incised with 42 mm diameter marker at border areola margin. Pedicle deepithlialized and developed to chest wall. Breast tissue resected over lower pole. Medial and lateral flaps developed. Additional superior and lateral breast tissue excised. Breast tailor tacked closed.    I then directed attention to right breast where  superior medial pedicle designed. NAC incised with 42 mm diameter marker at border areola margin. The pedicle was deepithelialized. Pedicle developed to chest wall. Breast tissue resected over lower pole. Medial and lateral flaps developed. Additional superior and lateral breast tissue excised. Breast tailor tacked closed. Patient assessed for symmetry. Breast cavities irrigated and hemostasis obtained. Local anesthetic infiltrated throughout each breast. 15 Fr JP placed in each breast and secured with 2-0 nylon. Closure completed bilateral with 3-0 vicryl to approximate dermis along inframammary fold and vertical limb. NAC inset with 3-0 vicryl in dermis. Skin closure completed with 4-0 monocryl subcuticular throughout. Tissue adhesive applied. Dry dressing and breast binder applied.   The patient was allowed to wake from anesthesia, extubated and taken to the recovery room in satisfactory condition.   SPECIMENS: right and left breast reduction  DRAINS: 15 Fr JP in right and left breast  Glenna Fellows, MD [facility] Plastic & Reconstructive Surgery  Office/ physician access line after hours [phone]

## 2022-10-27 NOTE — Interval H&P Note (Signed)
History and Physical Interval Note:  10/27/2022 6:58 AM  Susan Ramirez  has presented today for surgery, with the diagnosis of Macrosmastia chronic neck and back pain intertrigo.  The various methods of treatment have been discussed with the patient and family. After consideration of risks, benefits and other options for treatment, the patient has consented to  Procedure(s): MAMMARY REDUCTION  (BREAST) (Bilateral) as a surgical intervention.  The patient's history has been reviewed, patient examined, no change in status, stable for surgery.  I have reviewed the patient's chart and labs.  Questions were answered to the patient's satisfaction.     Irean Hong Linnie Delgrande

## 2022-10-27 NOTE — Anesthesia Preprocedure Evaluation (Signed)
Anesthesia Evaluation  Patient identified by MRN, date of birth, ID band Patient awake    Reviewed: Allergy & Precautions, H&P , NPO status , Patient's Chart, lab work & pertinent test results  History of Anesthesia Complications (+) PONV and history of anesthetic complications  Airway Mallampati: II  TM Distance: >3 FB Neck ROM: full    Dental no notable dental hx.    Pulmonary neg pulmonary ROS   Pulmonary exam normal breath sounds clear to auscultation       Cardiovascular hypertension, Pt. on medications negative cardio ROS Normal cardiovascular exam Rhythm:Regular Rate:Normal     Neuro/Psych    Depression       GI/Hepatic   Endo/Other    Renal/GU      Musculoskeletal   Abdominal   Peds  Hematology   Anesthesia Other Findings   Reproductive/Obstetrics                             Anesthesia Physical Anesthesia Plan  ASA: II  Anesthesia Plan: General   Post-op Pain Management:  Regional for Post-op pain and Celebrex PO (pre-op)*, Tylenol PO (pre-op)* and Gabapentin PO (pre-op)*   Induction: Intravenous  PONV Risk Score and Plan: 4 or greater and Ondansetron, Dexamethasone, Midazolam, Droperidol and Treatment may vary due to age or medical condition  Airway Management Planned: Oral ETT  Additional Equipment:   Intra-op Plan:   Post-operative Plan: Extubation in OR  Informed Consent: I have reviewed the patients History and Physical, chart, labs and discussed the procedure including the risks, benefits and alternatives for the proposed anesthesia with the patient or authorized representative who has indicated his/her understanding and acceptance.       Plan Discussed with: CRNA, Anesthesiologist and Surgeon  Anesthesia Plan Comments:         Anesthesia Quick Evaluation

## 2022-10-28 ENCOUNTER — Encounter (HOSPITAL_BASED_OUTPATIENT_CLINIC_OR_DEPARTMENT_OTHER): Payer: Self-pay | Admitting: Plastic Surgery

## 2022-10-28 LAB — SURGICAL PATHOLOGY

## 2022-12-25 ENCOUNTER — Encounter: Payer: Self-pay | Admitting: Medical-Surgical

## 2022-12-25 ENCOUNTER — Ambulatory Visit (INDEPENDENT_AMBULATORY_CARE_PROVIDER_SITE_OTHER): Admitting: Medical-Surgical

## 2022-12-25 VITALS — BP 149/90 | HR 75 | Resp 20 | Ht 65.0 in | Wt 176.8 lb

## 2022-12-25 DIAGNOSIS — I10 Essential (primary) hypertension: Secondary | ICD-10-CM | POA: Diagnosis not present

## 2022-12-25 DIAGNOSIS — E782 Mixed hyperlipidemia: Secondary | ICD-10-CM | POA: Diagnosis not present

## 2022-12-25 DIAGNOSIS — B001 Herpesviral vesicular dermatitis: Secondary | ICD-10-CM | POA: Diagnosis not present

## 2022-12-25 DIAGNOSIS — Z0000 Encounter for general adult medical examination without abnormal findings: Secondary | ICD-10-CM | POA: Diagnosis not present

## 2022-12-25 MED ORDER — VALACYCLOVIR HCL 1 G PO TABS: ORAL_TABLET | ORAL | 2 refills | Status: AC

## 2022-12-25 MED ORDER — LISINOPRIL 5 MG PO TABS: 5.0000 mg | ORAL_TABLET | Freq: Every day | ORAL | 3 refills | Status: DC

## 2022-12-25 NOTE — Progress Notes (Signed)
Complete physical exam  Patient: Susan Ramirez   DOB: 09-22-1960   62 y.o. Female  MRN: [id number]  Subjective:    Chief Complaint  Patient presents with   Annual Exam    Susan Ramirez is a 62 y.o. female who presents today for a complete physical exam. She reports consuming a general diet. The patient does not participate in regular exercise at present. She generally feels well. She reports sleeping well. She does have additional problems to discuss today.    Most recent fall risk assessment:    12/23/2020    8:35 AM  Fall Risk   Falls in the past year? 0  Number falls in past yr: 0  Injury with Fall? 0  Risk for fall due to : No Fall Risks  Follow up Falls evaluation completed     Most recent depression screenings:    12/25/2022   10:09 AM 12/22/2021    1:16 PM  PHQ 2/9 Scores  PHQ - 2 Score 0 0    Vision:Within last year, Dental: No current dental problems and Receives regular dental care, and STD: The patient denies history of sexually transmitted disease.    Patient Care Team: Christen Butter, NP as PCP - General (Nurse Practitioner)   Outpatient Medications Prior to Visit  Medication Sig   estradiol-norethindrone (ACTIVELLA) 1-0.5 MG tablet Take by mouth daily.   fluorouracil (EFUDEX) 5 % cream Apply topically.   Multiple Vitamin (MULTIVITAMIN WITH MINERALS) TABS tablet Take 1 tablet by mouth daily.   [DISCONTINUED] lisinopril (ZESTRIL) 5 MG tablet Take 1 tablet (5 mg total) by mouth daily.   [DISCONTINUED] valACYclovir (VALTREX) 1000 MG tablet TAKE 2 TABLETS BY MOUTH IN THE MORNING & 2TABS AT BEDTIME X 1 DAYS   No facility-administered medications prior to visit.   Review of Systems  Constitutional:  Positive for malaise/fatigue. Negative for chills and fever.  HENT:  Positive for congestion and sinus pain. Negative for ear pain and sore throat.   Respiratory:  Negative for cough, sputum production, shortness of breath and wheezing.   Cardiovascular:   Negative for chest pain, palpitations and leg swelling.  Gastrointestinal: Negative.   Genitourinary: Negative.   Musculoskeletal: Negative.   Neurological:  Negative for dizziness, tingling, weakness and headaches.  Psychiatric/Behavioral:  Negative for depression and suicidal ideas. The patient is not nervous/anxious and does not have insomnia.      Objective:    BP (!) 149/90 (BP Location: Left Arm, Cuff Size: Normal)   Pulse 75   Resp 20   Ht 5\' 5"  (1.651 m)   Wt 176 lb 12.8 oz (80.2 kg)   LMP 10/17/2012   SpO2 98%   BMI 29.42 kg/m    Physical Exam Vitals reviewed.  Constitutional:      General: She is not in acute distress.    Appearance: Normal appearance. She is not ill-appearing.  HENT:     Head: Normocephalic and atraumatic.     Right Ear: Tympanic membrane, ear canal and external ear normal. There is no impacted cerumen.     Left Ear: Tympanic membrane, ear canal and external ear normal. There is no impacted cerumen.     Nose: Nose normal. No congestion or rhinorrhea.     Mouth/Throat:     Mouth: Mucous membranes are moist.     Pharynx: No oropharyngeal exudate or posterior oropharyngeal erythema.  Eyes:     General: No scleral icterus.       Right eye:  No discharge.        Left eye: No discharge.     Extraocular Movements: Extraocular movements intact.     Conjunctiva/sclera: Conjunctivae normal.     Pupils: Pupils are equal, round, and reactive to light.  Neck:     Thyroid: No thyromegaly.     Vascular: No carotid bruit or JVD.     Trachea: Trachea normal.  Cardiovascular:     Rate and Rhythm: Normal rate and regular rhythm.     Pulses: Normal pulses.     Heart sounds: Normal heart sounds. No murmur heard.    No friction rub. No gallop.  Pulmonary:     Effort: Pulmonary effort is normal. No respiratory distress.     Breath sounds: Normal breath sounds. No wheezing.  Abdominal:     General: Bowel sounds are normal. There is no distension.      Palpations: Abdomen is soft.     Tenderness: There is no abdominal tenderness. There is no guarding.  Musculoskeletal:        General: Normal range of motion.     Cervical back: Normal range of motion and neck supple.  Lymphadenopathy:     Cervical: No cervical adenopathy.  Skin:    General: Skin is warm and dry.  Neurological:     Mental Status: She is alert and oriented to person, place, and time.     Cranial Nerves: No cranial nerve deficit.  Psychiatric:        Mood and Affect: Mood normal.        Behavior: Behavior normal.        Thought Content: Thought content normal.        Judgment: Judgment normal.      No results found for any visits on 12/25/22.     Assessment & Plan:    Routine Health Maintenance and Physical Exam  Immunization History  Administered Date(s) Administered   Influenza,inj,Quad PF,6+ Mos 12/22/2019   Influenza-Unspecified 12/06/2019, 11/05/2020, 11/10/2021   Moderna Sars-Covid-2 Vaccination 08/17/2019, 09/16/2019   Tdap 12/22/2019   Unspecified SARS-COV-2 Vaccination 09/29/2019   Zoster Recombinant(Shingrix) 03/09/2022, 07/03/2022    Health Maintenance  Topic Date Due   COVID-19 Vaccine (4 - 2024-25 season) 01/10/2023 (Originally 09/06/2022)   INFLUENZA VACCINE  04/05/2023 (Originally 08/06/2022)   MAMMOGRAM  12/25/2023 (Originally 05/15/2022)   Cervical Cancer Screening (HPV/Pap Cotest)  04/28/2024   DTaP/Tdap/Td (2 - Td or Tdap) 12/21/2029   Colonoscopy  02/18/2030   Hepatitis C Screening  Completed   HIV Screening  Completed   Zoster Vaccines- Shingrix  Completed   HPV VACCINES  Aged Out    Discussed health benefits of physical activity, and encouraged her to engage in regular exercise appropriate for her age and condition.  1. Annual physical exam (Primary) Checking labs as below.  Up-to-date on preventative care.  Wellness information provided with AVS.  Deferring mammogram at this time as she just recently had bilateral breast reduction  and is currently healing. - CBC with Differential/Platelet - CMP14+EGFR - Lipid panel  2. Primary hypertension Blood pressure elevated on arrival and again on recheck.  Would like her to measure blood pressure at home and keep a log.  For now continue lisinopril 5 mg daily.  Checking labs as below. - CBC with Differential/Platelet - CMP14+EGFR - Lipid panel - lisinopril (ZESTRIL) 5 MG tablet; Take 1 tablet (5 mg total) by mouth daily.  Dispense: 90 tablet; Refill: 3  3. Mixed hyperlipidemia Checking labs as below. -  CMP14+EGFR - Lipid panel  4. Fever blister History of fever blister.  Refilling Valtrex for as needed use. - valACYclovir (VALTREX) 1000 MG tablet; TAKE 2 TABLETS BY MOUTH IN THE MORNING & 2TABS AT BEDTIME X 1 DAYS  Dispense: 20 tablet; Refill: 2  Return in about 6 months (around 06/25/2023) for HTN follow up.     Christen Butter, NP

## 2022-12-25 NOTE — Patient Instructions (Addendum)
Preventive Care 40-64 Years Old, Female Preventive care refers to lifestyle choices and visits with your health care provider that can promote health and wellness. Preventive care visits are also called wellness exams. What can I expect for my preventive care visit? Counseling Your health care provider may ask you questions about your: Medical history, including: Past medical problems. Family medical history. Pregnancy history. Current health, including: Menstrual cycle. Method of birth control. Emotional well-being. Home life and relationship well-being. Sexual activity and sexual health. Lifestyle, including: Alcohol, nicotine or tobacco, and drug use. Access to firearms. Diet, exercise, and sleep habits. Work and work environment. Sunscreen use. Safety issues such as seatbelt and bike helmet use. Physical exam Your health care provider will check your: Height and weight. These may be used to calculate your BMI (body mass index). BMI is a measurement that tells if you are at a healthy weight. Waist circumference. This measures the distance around your waistline. This measurement also tells if you are at a healthy weight and may help predict your risk of certain diseases, such as type 2 diabetes and high blood pressure. Heart rate and blood pressure. Body temperature. Skin for abnormal spots. What immunizations do I need?  Vaccines are usually given at various ages, according to a schedule. Your health care provider will recommend vaccines for you based on your age, medical history, and lifestyle or other factors, such as travel or where you work. What tests do I need? Screening Your health care provider may recommend screening tests for certain conditions. This may include: Lipid and cholesterol levels. Diabetes screening. This is done by checking your blood sugar (glucose) after you have not eaten for a while (fasting). Pelvic exam and Pap test. Hepatitis B test. Hepatitis C  test. HIV (human immunodeficiency virus) test. STI (sexually transmitted infection) testing, if you are at risk. Lung cancer screening. Colorectal cancer screening. Mammogram. Talk with your health care provider about when you should start having regular mammograms. This may depend on whether you have a family history of breast cancer. BRCA-related cancer screening. This may be done if you have a family history of breast, ovarian, tubal, or peritoneal cancers. Bone density scan. This is done to screen for osteoporosis. Talk with your health care provider about your test results, treatment options, and if necessary, the need for more tests. Follow these instructions at home: Eating and drinking  Eat a diet that includes fresh fruits and vegetables, whole grains, lean protein, and low-fat dairy products. Take vitamin and mineral supplements as recommended by your health care provider. Do not drink alcohol if: Your health care provider tells you not to drink. You are pregnant, may be pregnant, or are planning to become pregnant. If you drink alcohol: Limit how much you have to 0-1 drink a day. Know how much alcohol is in your drink. In the [place] one drink equals one 12 oz bottle of beer (355 mL), one 5 oz glass of wine (148 mL), or one 1 oz glass of hard liquor (44 mL). Lifestyle Brush your teeth every morning and night with fluoride toothpaste. Floss one time each day. Exercise for at least 30 minutes 5 or more days each week. Do not use any products that contain nicotine or tobacco. These products include cigarettes, chewing tobacco, and vaping devices, such as e-cigarettes. If you need help quitting, ask your health care provider. Do not use drugs. If you are sexually active, practice safe sex. Use a condom or other form of protection to   prevent STIs. If you do not wish to become pregnant, use a form of birth control. If you plan to become pregnant, see your health care provider for a  prepregnancy visit. Take aspirin only as told by your health care provider. Make sure that you understand how much to take and what form to take. Work with your health care provider to find out whether it is safe and beneficial for you to take aspirin daily. Find healthy ways to manage stress, such as: Meditation, yoga, or listening to music. Journaling. Talking to a trusted person. Spending time with friends and family. Minimize exposure to UV radiation to reduce your risk of skin cancer. Safety Always wear your seat belt while driving or riding in a vehicle. Do not drive: If you have been drinking alcohol. Do not ride with someone who has been drinking. When you are tired or distracted. While texting. If you have been using any mind-altering substances or drugs. Wear a helmet and other protective equipment during sports activities. If you have firearms in your house, make sure you follow all gun safety procedures. Seek help if you have been physically or sexually abused. What's next? Visit your health care provider once a year for an annual wellness visit. Ask your health care provider how often you should have your eyes and teeth checked. Stay up to date on all vaccines. This information is not intended to replace advice given to you by your health care provider. Make sure you discuss any questions you have with your health care provider. Document Revised: 06/19/2020 Document Reviewed: 06/19/2020 Elsevier Patient Education  2024 [employer] Inc.  

## 2022-12-26 LAB — LIPID PANEL
Chol/HDL Ratio: 3.8 {ratio} (ref 0.0–4.4)
Cholesterol, Total: 233 mg/dL — ABNORMAL HIGH (ref 100–199)
HDL: 61 mg/dL (ref >39)
LDL Chol Calc (NIH): 133 mg/dL — ABNORMAL HIGH (ref 0–99)
Triglycerides: 221 mg/dL — ABNORMAL HIGH (ref 0–149)
VLDL Cholesterol Cal: 39 mg/dL (ref 5–40)

## 2022-12-26 LAB — CMP14+EGFR
ALT: 24 [IU]/L (ref 0–32)
AST: 25 [IU]/L (ref 0–40)
Albumin: 4.6 g/dL (ref 3.9–4.9)
Alkaline Phosphatase: 90 [IU]/L (ref 44–121)
BUN/Creatinine Ratio: 19 (ref 12–28)
BUN: 16 mg/dL (ref 8–27)
Bilirubin Total: 0.2 mg/dL (ref 0.0–1.2)
CO2: 22 mmol/L (ref 20–29)
Calcium: 9.4 mg/dL (ref 8.7–10.3)
Chloride: 103 mmol/L (ref 96–106)
Creatinine, Ser: 0.85 mg/dL (ref 0.57–1.00)
Globulin, Total: 2.3 g/dL (ref 1.5–4.5)
Glucose: 89 mg/dL (ref 70–99)
Potassium: 4.6 mmol/L (ref 3.5–5.2)
Sodium: 141 mmol/L (ref 134–144)
Total Protein: 6.9 g/dL (ref 6.0–8.5)
eGFR: 77 mL/min/{1.73_m2} (ref >59)

## 2022-12-26 LAB — CBC WITH DIFFERENTIAL/PLATELET
Basophils Absolute: 0 10*3/uL (ref 0.0–0.2)
Basos: 0 %
EOS (ABSOLUTE): 0 10*3/uL (ref 0.0–0.4)
Eos: 1 %
Hematocrit: 48.3 % — ABNORMAL HIGH (ref 34.0–46.6)
Hemoglobin: 15.8 g/dL (ref 11.1–15.9)
Immature Grans (Abs): 0 10*3/uL (ref 0.0–0.1)
Immature Granulocytes: 0 %
Lymphocytes Absolute: 1.7 10*3/uL (ref 0.7–3.1)
Lymphs: 32 %
MCH: 31.7 pg (ref 26.6–33.0)
MCHC: 32.7 g/dL (ref 31.5–35.7)
MCV: 97 fL (ref 79–97)
Monocytes Absolute: 0.4 10*3/uL (ref 0.1–0.9)
Monocytes: 7 %
Neutrophils Absolute: 3.2 10*3/uL (ref 1.4–7.0)
Neutrophils: 60 %
Platelets: 270 10*3/uL (ref 150–450)
RBC: 4.99 x10E6/uL (ref 3.77–5.28)
RDW: 11.6 % — ABNORMAL LOW (ref 11.7–15.4)
WBC: 5.4 10*3/uL (ref 3.4–10.8)

## 2022-12-28 ENCOUNTER — Encounter: Payer: Self-pay | Admitting: Medical-Surgical

## 2023-02-20 IMAGING — MG MM DIGITAL DIAGNOSTIC UNILAT*L* W/ TOMO W/ CAD
4 series · 4 of 12 positions shown · non-contrast
Comparison: Previous exam(s).

CLINICAL DATA: The patient was called back for a possible left
breast mass only seen on the cc view.

EXAM:
DIGITAL DIAGNOSTIC UNILATERAL LEFT MAMMOGRAM WITH TOMOSYNTHESIS AND
CAD
TECHNIQUE: Left digital diagnostic mammography and breast tomosynthesis was
performed. The images were evaluated with computer-aided detection.

[L ML synth-2D]
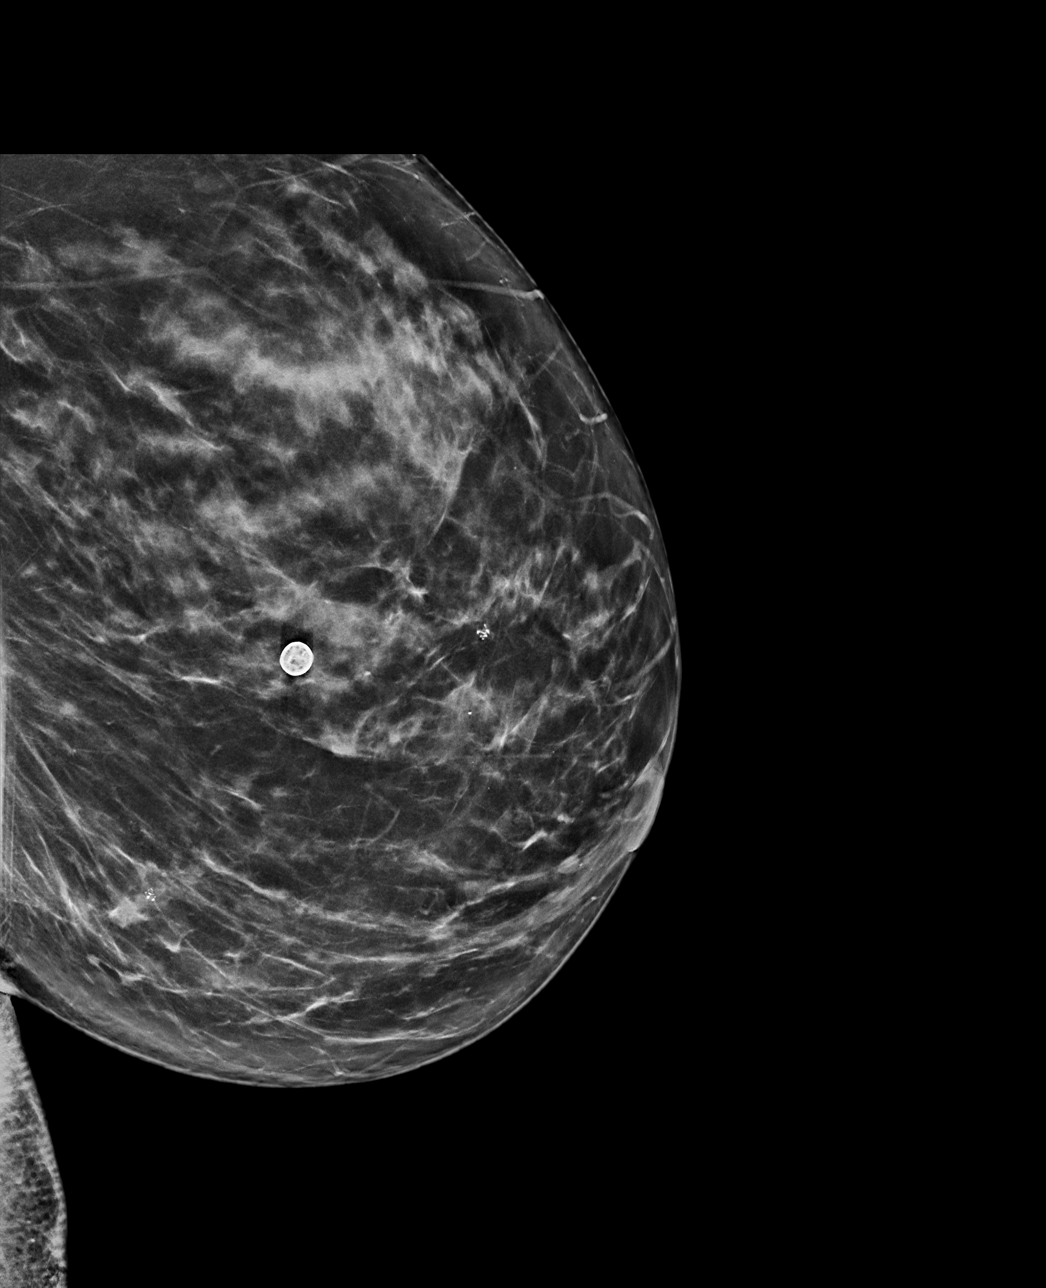

[L CC synth-2D]
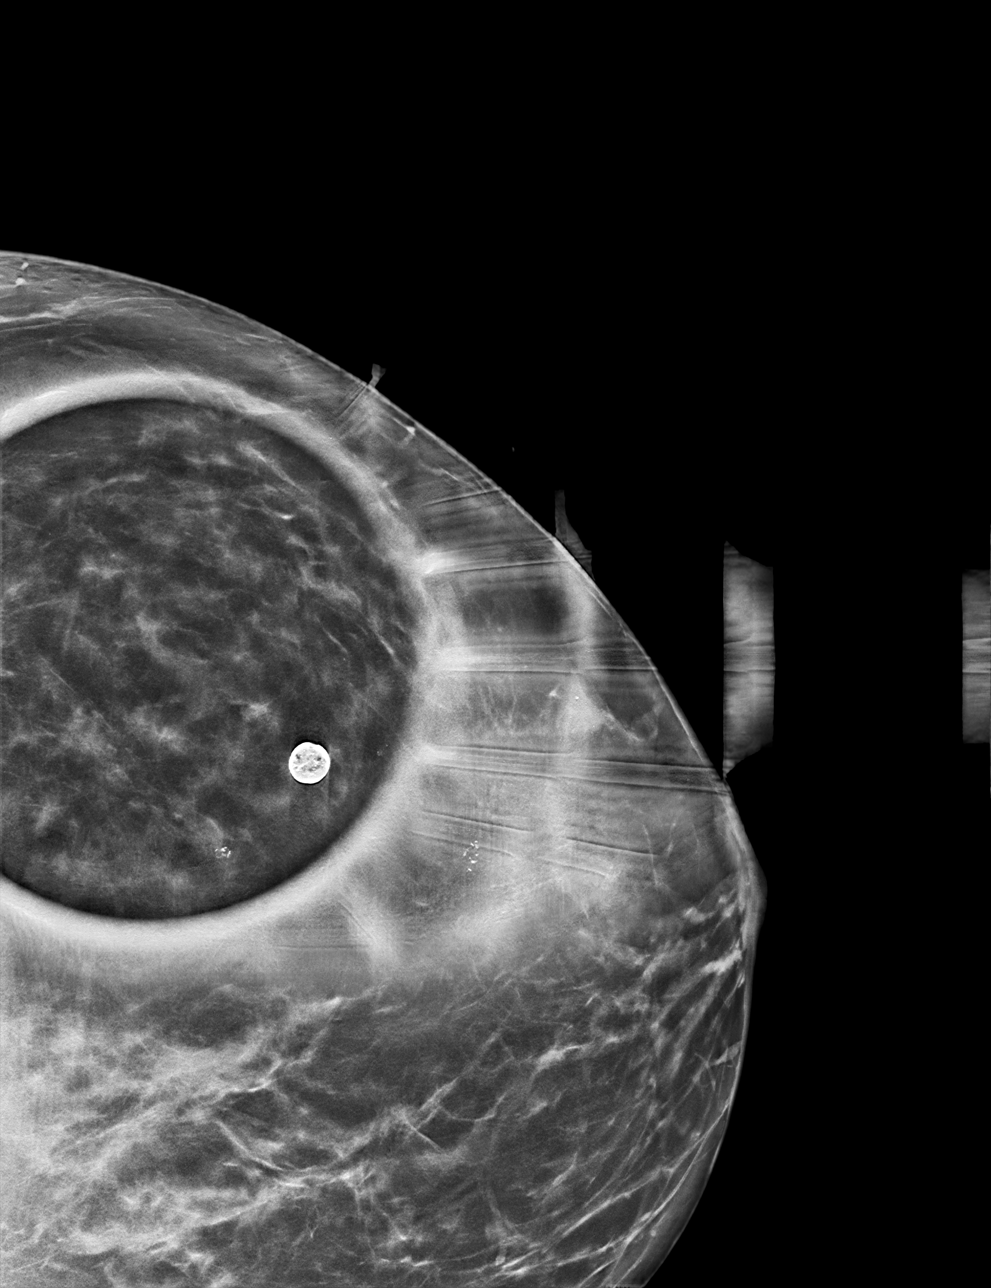

[L ML tomo · tomo slice 41/82.0]
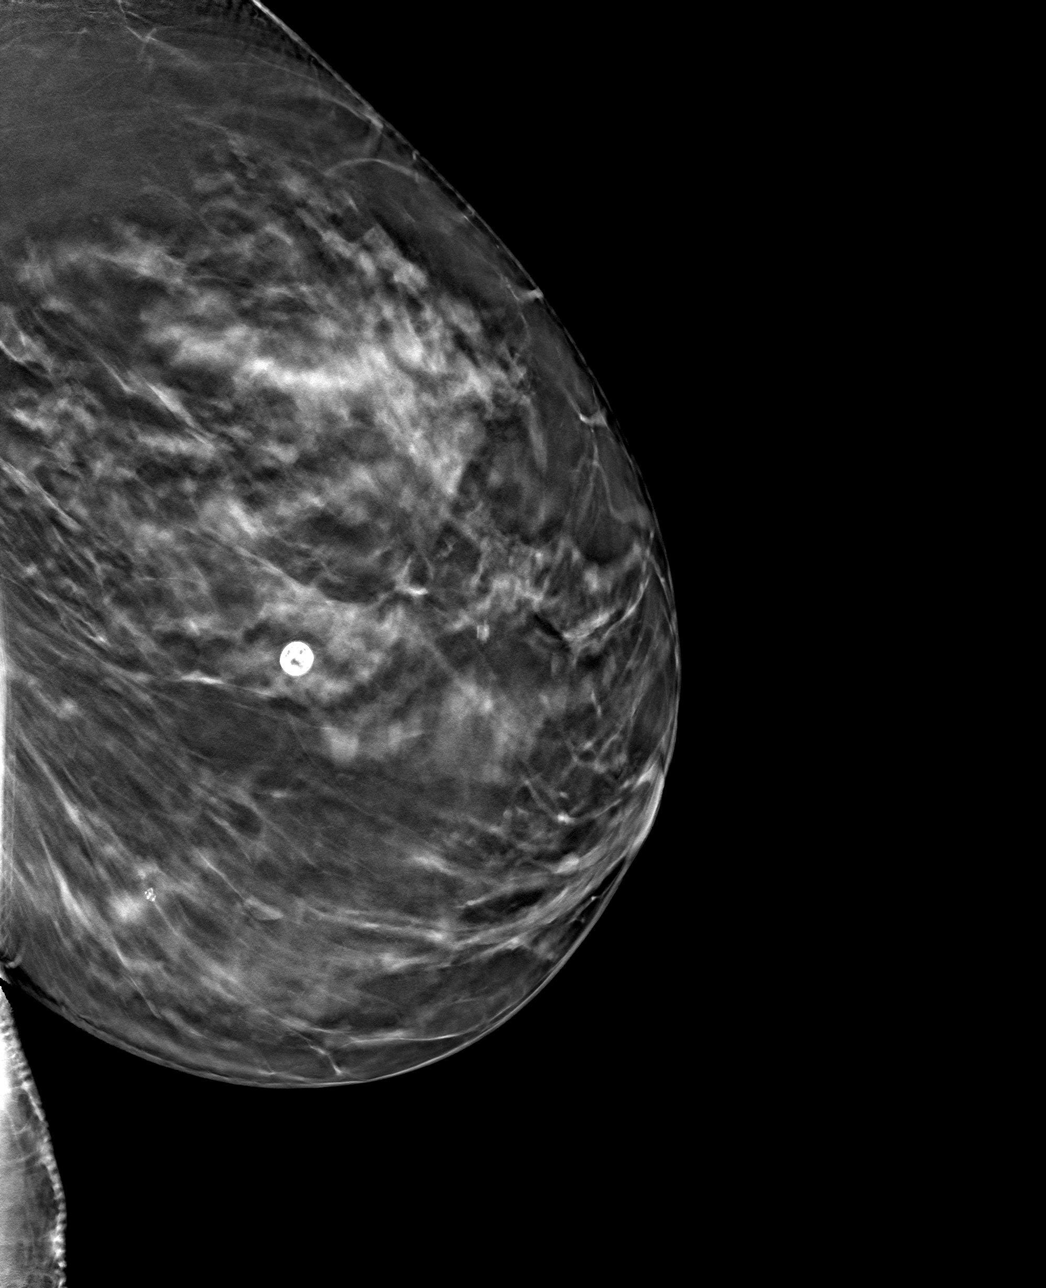

[L CC tomo · tomo slice 39/78.0]
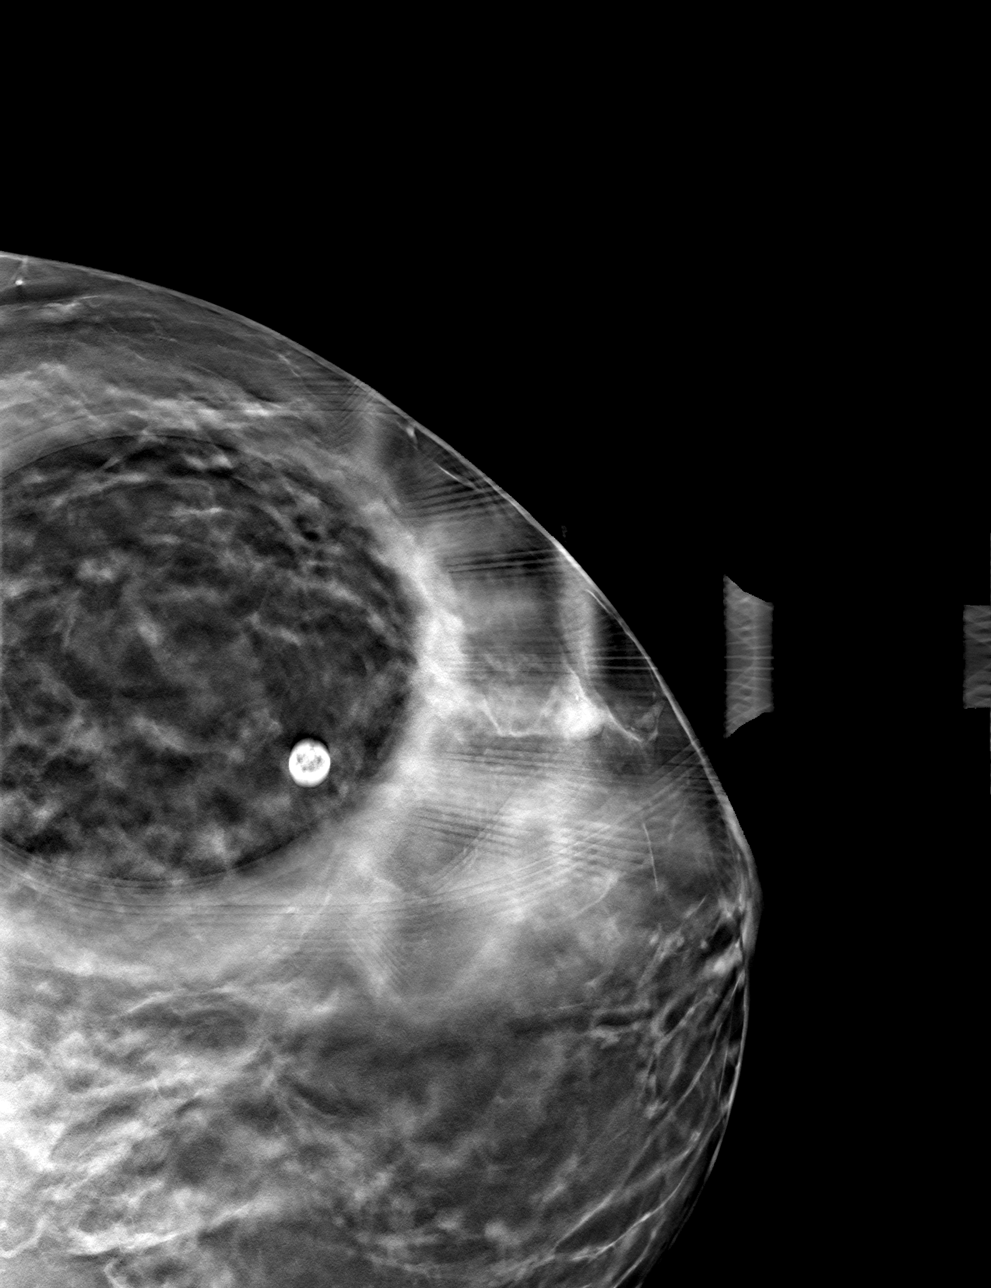

[4 of 12 positions shown; findings below may reference images not displayed]

ACR Breast Density Category c: The breast tissue is heterogeneously
dense, which may obscure small masses.
FINDINGS: The possible left breast mass resolves on today's imaging.
IMPRESSION: No mammographic evidence of malignancy.

RECOMMENDATION:
Annual screening mammography.

I have discussed the findings and recommendations with the patient.
If applicable, a reminder letter will be sent to the patient
regarding the next appointment.

BI-RADS CATEGORY  1: Negative.

## 2023-05-25 LAB — HM MAMMOGRAPHY

## 2023-06-29 ENCOUNTER — Ambulatory Visit: Admitting: Medical-Surgical

## 2023-07-06 ENCOUNTER — Ambulatory Visit: Admitting: Medical-Surgical

## 2023-08-24 ENCOUNTER — Ambulatory Visit: Admitting: Medical-Surgical

## 2023-08-24 ENCOUNTER — Encounter: Payer: Self-pay | Admitting: Medical-Surgical

## 2023-08-24 VITALS — BP 125/85 | HR 83 | Resp 20 | Ht 65.0 in | Wt 186.0 lb

## 2023-08-24 DIAGNOSIS — F329 Major depressive disorder, single episode, unspecified: Secondary | ICD-10-CM

## 2023-08-24 MED ORDER — SERTRALINE HCL 50 MG PO TABS
50.0000 mg | ORAL_TABLET | Freq: Every day | ORAL | 3 refills | Status: DC
Start: 2023-09-23 — End: 2023-10-27

## 2023-08-24 MED ORDER — SERTRALINE HCL 50 MG PO TABS
ORAL_TABLET | ORAL | 0 refills | Status: DC
Start: 2023-08-24 — End: 2023-10-27

## 2023-08-24 NOTE — Progress Notes (Signed)
        Established patient visit   History of Present Illness   Discussed the use of AI scribe software for clinical note transcription with the patient, who gave verbal consent to proceed.  History of Present Illness   Susan Ramirez is a 63 year old female who presents with increased irritability, weight gain, hair loss, and sleep disturbances.  Affective and behavioral symptoms - Increased irritability and agitation, with minor incidents triggering significant emotional responses - Feelings of inadequacy in managing household tasks - Emotional distress related to husband's recent retirement and frequent arguments - Recent falling out with daughter, resulting in not seeing grandchildren for three months; attempted reconciliation over dinner was unsuccessful, but another meeting is planned - No thoughts of self-harm or harm to others  Sleep disturbances and vasomotor symptoms - Difficulty sleeping, with frequent awakenings throughout the night - Experiences 'hot and cold' sensations at night, requiring frequent adjustment of covers - Describes sleep disturbances as reminiscent of menopausal symptoms  Weight gain and hair loss - Recent weight gain - Ongoing hair loss  Lifestyle modifications - Initiated walking for exercise in the past two days, which she finds beneficial for mental health - No current engagement in counseling and expresses skepticism about its potential benefits      Physical Exam   Physical Exam Vitals reviewed.  Constitutional:      General: She is not in acute distress.    Appearance: Normal appearance. She is not ill-appearing.  HENT:     Head: Normocephalic and atraumatic.  Cardiovascular:     Rate and Rhythm: Normal rate and regular rhythm.     Pulses: Normal pulses.     Heart sounds: Normal heart sounds. No murmur heard.    No friction rub. No gallop.  Pulmonary:     Effort: Pulmonary effort is normal. No respiratory distress.     Breath  sounds: Normal breath sounds. No wheezing.  Skin:    General: Skin is warm and dry.  Neurological:     Mental Status: She is alert and oriented to person, place, and time.  Psychiatric:        Mood and Affect: Mood is depressed. Affect is tearful.        Speech: Speech normal.        Behavior: Behavior normal. Behavior is cooperative.        Thought Content: Thought content normal.        Judgment: Judgment normal.     Assessment & Plan   Assessment and Plan    Depression with anxiety symptoms Symptoms consistent with depression and anxiety, exacerbated by life changes. Previously responded well to Zoloft . - Restart Zoloft  at 25 mg daily for one week, then increase to 50 mg daily. - Discuss potential use of Buspar as needed for anxiety and irritability if symptoms worsen before Zoloft  takes full effect. - Send prescription to [facility] for immediate start, with subsequent refills through [facility]. - Discuss over-the-counter herbal options for sleep, including melatonin, valerian root, kava, chamomile, and lemon balm.      Follow up   Return in about 4 weeks (around 09/21/2023) for mood follow up or sooner if needed.  __________________________________ Zada FREDRIK Palin, DNP, APRN, FNP-BC Primary Care and Sports Medicine [facility]

## 2023-08-24 NOTE — Patient Instructions (Signed)
 Ashwaghanda Melatonin Valerian root Kava Chamomile Sleepy time teas

## 2023-10-25 ENCOUNTER — Ambulatory Visit: Payer: Self-pay

## 2023-10-25 NOTE — Telephone Encounter (Signed)
 FYI Only or Action Required?: FYI only for provider.  Patient was last seen in primary care on 08/24/2023 by Willo Mini, NP.  Called Nurse Triage reporting Otalgia.  Symptoms began couple weeks.  Interventions attempted: OTC medications: ear drops.  Symptoms are: unchanged.  Triage Disposition: See Physician Within 24 Hours  Patient/caregiver understands and will follow disposition?: Yes    Copied from CRM #[id number]. Topic: Clinical - Red Word Triage >> Oct 25, 2023 10:57 AM Delon HERO wrote: Red Word that prompted transfer to Nurse Triage: Patient is calling to report ear pain in right ear for a couple of weeks. Reason for Disposition  Earache  (Exceptions: Brief ear pain of lasting less than 60 minutes, or earache occurring during air travel.)    Ear pain ongoing for 2 to 3 weeks / PCP next available on Wednesday: scheduled appt with PCP per pt  Answer Assessment - Initial Assessment Questions 1. LOCATION: Which ear is involved?     Right ear 2. ONSET: When did the ear pain start?      X couple weeks 3. SEVERITY: How bad is the pain?  (Scale 1-10; mild, moderate or severe)     5/10 4. URI SYMPTOMS: Do you have a runny nose or cough?     Runny nose, cough, head & chest congestion, 5. FEVER: Do you have a fever? If Yes, ask: What is your temperature, how was it measured, and when did it start?     no 6. CAUSE: Have you been swimming recently?, How often do you use Q-TIPS?, Have you had any recent air travel or scuba diving?     Pt does us  Q-tips every couple of weeks, 7. OTHER SYMPTOMS: Do you have any other symptoms? (e.g., decreased hearing, dizziness, headache, stiff neck, vomiting)     no 8. PREGNANCY: Is there any chance you are pregnant? When was your last menstrual period?     na  Protocols used: [place]

## 2023-10-27 ENCOUNTER — Ambulatory Visit: Admitting: Medical-Surgical

## 2023-10-27 ENCOUNTER — Encounter: Payer: Self-pay | Admitting: Medical-Surgical

## 2023-10-27 VITALS — BP 130/80 | HR 106 | Resp 20 | Ht 65.0 in | Wt 185.9 lb

## 2023-10-27 DIAGNOSIS — I10 Essential (primary) hypertension: Secondary | ICD-10-CM

## 2023-10-27 DIAGNOSIS — H66001 Acute suppurative otitis media without spontaneous rupture of ear drum, right ear: Secondary | ICD-10-CM

## 2023-10-27 DIAGNOSIS — F329 Major depressive disorder, single episode, unspecified: Secondary | ICD-10-CM

## 2023-10-27 DIAGNOSIS — Z23 Encounter for immunization: Secondary | ICD-10-CM | POA: Diagnosis not present

## 2023-10-27 MED ORDER — AMOXICILLIN-POT CLAVULANATE 875-125 MG PO TABS: 1.0000 | ORAL_TABLET | Freq: Two times a day (BID) | ORAL | 0 refills | Status: DC

## 2023-10-27 MED ORDER — SERTRALINE HCL 50 MG PO TABS: 50.0000 mg | ORAL_TABLET | Freq: Every day | ORAL | 3 refills | Status: AC

## 2023-10-27 MED ORDER — LISINOPRIL 5 MG PO TABS: 5.0000 mg | ORAL_TABLET | Freq: Every day | ORAL | 3 refills | Status: DC

## 2023-10-27 NOTE — Progress Notes (Signed)
        Established patient visit  History, exam, impression, and plan:  1. Non-recurrent acute suppurative otitis media of right ear without spontaneous rupture of tympanic membrane (Primary) Very pleasant 63 year old female presenting today with reports of 2 weeks of right ear pain that has now extended into the neck just below the ear and the jaw.  The discomfort feels like a tickling or burning and is constant.  She has also had some cold symptoms including scratchy throat and congestion.  Has a cough productive of clear mucus.  No fevers, chills, nausea, vomiting, diarrhea.  No left ear symptoms.  On evaluation, her symptoms are consistent with right otitis media.  Treating with Augmentin  twice daily x 10 days.  Okay to use Tylenol /ibuprofen for pain.  2. Current episode of major depressive disorder without prior episode, unspecified depression episode severity Has been treated with sertraline  50 mg daily which is well-tolerated and effective.  Mood is stable with no current concerns.  Denies SI/HI.  Refilling sertraline  today.  3. Essential hypertension Taking lisinopril  5 mg daily with excellent control of hypertension.  Blood pressure is at goal today.  Cardiopulmonary exam is normal and she has no concerning symptoms.  Continue lisinopril  as prescribed. - lisinopril  (ZESTRIL ) 5 MG tablet; Take 1 tablet (5 mg total) by mouth daily.  Dispense: 90 tablet; Refill: 3  4. Need for influenza vaccination Flu vaccine given in office today. - Flu vaccine trivalent PF, 6mos and older(Flulaval,Afluria,Fluarix,Fluzone)   Physical Exam Vitals reviewed.  Constitutional:      General: She is not in acute distress.    Appearance: Normal appearance.  HENT:     Head: Normocephalic and atraumatic.     Right Ear: External ear normal. There is no impacted cerumen.     Ears:     Comments: Canal erythema with TM erythema/bulging, and cloudy yellow effusion Cardiovascular:     Rate and Rhythm: Normal  rate and regular rhythm.     Pulses: Normal pulses.     Heart sounds: Normal heart sounds. No murmur heard.    No friction rub. No gallop.  Pulmonary:     Effort: Pulmonary effort is normal. No respiratory distress.     Breath sounds: Normal breath sounds. No wheezing.  Skin:    General: Skin is warm and dry.  Neurological:     Mental Status: She is alert and oriented to person, place, and time.  Psychiatric:        Mood and Affect: Mood normal.        Behavior: Behavior normal.        Thought Content: Thought content normal.        Judgment: Judgment normal.     Procedures performed this visit: None.  Return if symptoms worsen or fail to improve.  __________________________________ Zada FREDRIK Palin, DNP, APRN, FNP-BC Primary Care and Sports Medicine [facility] [place]

## 2023-12-27 ENCOUNTER — Encounter: Admitting: Medical-Surgical

## 2023-12-27 ENCOUNTER — Other Ambulatory Visit: Payer: Self-pay | Admitting: Medical-Surgical

## 2023-12-27 DIAGNOSIS — I10 Essential (primary) hypertension: Secondary | ICD-10-CM

## 2024-01-12 ENCOUNTER — Ambulatory Visit (INDEPENDENT_AMBULATORY_CARE_PROVIDER_SITE_OTHER): Admitting: Medical-Surgical

## 2024-01-12 ENCOUNTER — Encounter: Payer: Self-pay | Admitting: Medical-Surgical

## 2024-01-12 VITALS — BP 118/82 | HR 90 | Temp 97.6°F | Resp 20 | Ht 65.0 in | Wt 188.0 lb

## 2024-01-12 DIAGNOSIS — I10 Essential (primary) hypertension: Secondary | ICD-10-CM | POA: Diagnosis not present

## 2024-01-12 DIAGNOSIS — Z0000 Encounter for general adult medical examination without abnormal findings: Secondary | ICD-10-CM | POA: Diagnosis not present

## 2024-01-12 DIAGNOSIS — J0100 Acute maxillary sinusitis, unspecified: Secondary | ICD-10-CM

## 2024-01-12 MED ORDER — LISINOPRIL 5 MG PO TABS: 5.0000 mg | ORAL_TABLET | Freq: Every day | ORAL | 3 refills | Status: DC

## 2024-01-12 MED ORDER — AZITHROMYCIN 250 MG PO TABS: ORAL_TABLET | ORAL | 0 refills | Status: AC

## 2024-01-12 NOTE — Progress Notes (Signed)
 "  Complete physical exam  Patient: Susan Ramirez   DOB: [birth year]   64 y.o. Female  MRN: [id number]  Subjective:    Chief Complaint  Patient presents with   Annual Exam   Susan Ramirez is a 64 y.o. female who presents today for a complete physical exam. She reports consuming a general diet. The patient does not participate in regular exercise at present. She generally feels fairly well. She reports sleeping poorly. She does not have additional problems to discuss today.   Most recent fall risk assessment:    01/12/2024    9:01 AM  Fall Risk   Falls in the past year? 0  Number falls in past yr: 0  Injury with Fall? 0  Risk for fall due to : No Fall Risks  Follow up Falls evaluation completed     Most recent depression screenings:    01/12/2024    9:01 AM 08/24/2023    1:46 PM  PHQ 2/9 Scores  PHQ - 2 Score 0 2  PHQ- 9 Score 0 13      Data saved with a previous flowsheet row definition    Vision:Within last year and Dental: No current dental problems and Receives regular dental care    Patient Care Team: Willo Mini, NP as PCP - General (Nurse Practitioner)   Show/hide medication list[1]  Review of Systems  Constitutional:  Negative for chills, fever, malaise/fatigue and weight loss.  HENT:  Positive for congestion, ear pain, sinus pain and sore throat (left). Negative for hearing loss.   Eyes:  Positive for discharge. Negative for blurred vision, photophobia and pain.  Respiratory:  Positive for cough. Negative for sputum production, shortness of breath and wheezing.   Cardiovascular:  Negative for chest pain, palpitations and leg swelling.  Gastrointestinal:  Negative for abdominal pain, constipation, diarrhea, heartburn, nausea and vomiting.  Genitourinary:  Negative for dysuria, frequency and urgency.  Musculoskeletal:  Positive for joint pain (bilateral hips and knees). Negative for falls and neck pain.  Skin:  Negative for itching and rash.  Neurological:   Positive for headaches. Negative for dizziness and weakness.  Endo/Heme/Allergies:  Negative for polydipsia. Does not bruise/bleed easily.  Psychiatric/Behavioral:  Negative for depression, substance abuse and suicidal ideas. The patient is not nervous/anxious.      Objective:    BP 131/84 (BP Location: Right Arm, Cuff Size: Normal)   Pulse 93   Temp 97.6 F (36.4 C) (Oral)   Resp 20   Ht 5' 5 (1.651 m)   Wt 188 lb 0.6 oz (85.3 kg)   LMP 10/16/2012   SpO2 98%   BMI 31.29 kg/m    Physical Exam Constitutional:      General: She is not in acute distress.    Appearance: Normal appearance. She is not ill-appearing.  HENT:     Head: Normocephalic and atraumatic.     Right Ear: Ear canal and external ear normal. A middle ear effusion is present. There is no impacted cerumen.     Left Ear: Ear canal and external ear normal. A middle ear effusion is present. There is no impacted cerumen.     Nose: No congestion or rhinorrhea.     Right Sinus: Maxillary sinus tenderness present.     Left Sinus: Maxillary sinus tenderness present.     Mouth/Throat:     Mouth: Mucous membranes are moist.     Pharynx: No oropharyngeal exudate or posterior oropharyngeal erythema.  Eyes:  General: No scleral icterus.       Right eye: No discharge.        Left eye: No discharge.     Extraocular Movements: Extraocular movements intact.     Conjunctiva/sclera: Conjunctivae normal.     Pupils: Pupils are equal, round, and reactive to light.  Neck:     Thyroid: No thyromegaly.     Vascular: No carotid bruit or JVD.     Trachea: Trachea normal.  Cardiovascular:     Rate and Rhythm: Normal rate and regular rhythm.     Pulses: Normal pulses.     Heart sounds: Normal heart sounds. No murmur heard.    No friction rub. No gallop.  Pulmonary:     Effort: Pulmonary effort is normal. No respiratory distress.     Breath sounds: Normal breath sounds. No wheezing.  Abdominal:     General: Bowel sounds are  normal. There is no distension.     Palpations: Abdomen is soft.     Tenderness: There is no abdominal tenderness. There is no guarding.  Musculoskeletal:        General: Normal range of motion.     Cervical back: Normal range of motion and neck supple.  Lymphadenopathy:     Cervical: No cervical adenopathy.  Skin:    General: Skin is warm and dry.  Neurological:     Mental Status: She is alert and oriented to person, place, and time.     Cranial Nerves: No cranial nerve deficit.  Psychiatric:        Mood and Affect: Mood normal.        Behavior: Behavior normal.        Thought Content: Thought content normal.        Judgment: Judgment normal.    No results found for any visits on 01/12/24.     Assessment & Plan:    Routine Health Maintenance and Physical Exam  Immunization History  Administered Date(s) Administered   Influenza, Seasonal, Injecte, Preservative Fre 10/27/2023   Influenza,inj,Quad PF,6+ Mos 12/22/2019   Influenza-Unspecified 12/06/2019, 11/05/2020, 11/10/2021   Moderna Sars-Covid-2 Vaccination 08/17/2019, 09/16/2019   Tdap 12/22/2019   Unspecified SARS-COV-2 Vaccination 09/29/2019   Zoster Recombinant(Shingrix ) 03/09/2022, 07/03/2022    Health Maintenance  Topic Date Due   Pneumococcal Vaccine: 50+ Years (1 of 1 - PCV) Never done   COVID-19 Vaccine (4 - 2025-26 season) 01/28/2024 (Originally 09/06/2023)   Cervical Cancer Screening (HPV/Pap Cotest)  04/28/2024   Mammogram  05/24/2025   DTaP/Tdap/Td (2 - Td or Tdap) 12/21/2029   Colonoscopy  02/18/2030   Influenza Vaccine  Completed   Hepatitis C Screening  Completed   HIV Screening  Completed   Zoster Vaccines- Shingrix   Completed   Hepatitis B Vaccines 19-59 Average Risk  Aged Out   HPV VACCINES  Aged Out   Meningococcal B Vaccine  Aged Out    Discussed health benefits of physical activity, and encouraged her to engage in regular exercise appropriate for her age and  condition.  1. Annual physical exam (Primary) Checking labs as below. UTD on preventative care. Wellness information provided with AVS. - CBC with Differential/Platelet - Comprehensive metabolic panel with GFR - Lipid panel  2. Acute non-recurrent maxillary sinusitis - Start Azithromycin  500mg  today followed by 250mg  daily for the next four days.  3. Essential hypertension BP stable. Updating labs. Continue lisinopril  5mg  daily.  - lisinopril  (ZESTRIL ) 5 MG tablet; Take 1 tablet (5 mg total) by mouth daily.  Dispense: 90 tablet; Refill: 3 - CBC with Differential/Platelet - Comprehensive metabolic panel with GFR - Lipid panel  Return in about 6 months (around 07/11/2024) for HTN follow up.     Zada Palin, NP       [1] Outpatient Medications Prior to Visit  Medication Sig   fluorouracil (EFUDEX) 5 % cream Apply topically.   Multiple Vitamin (MULTIVITAMIN WITH MINERALS) TABS tablet Take 1 tablet by mouth daily.   Omega-3 Fatty Acids (FISH OIL PO) Take by mouth.   sertraline  (ZOLOFT ) 50 MG tablet Take 1 tablet (50 mg total) by mouth daily.   valACYclovir  (VALTREX ) 1000 MG tablet TAKE 2 TABLETS BY MOUTH IN THE MORNING & 2TABS AT BEDTIME X 1 DAYS   [DISCONTINUED] amoxicillin -clavulanate (AUGMENTIN ) 875-125 MG tablet Take 1 tablet by mouth 2 (two) times daily.   [DISCONTINUED] estradiol-norethindrone (ACTIVELLA) 1-0.5 MG tablet Take by mouth daily.   [DISCONTINUED] lisinopril  (ZESTRIL ) 5 MG tablet TAKE 1 TABLET DAILY   No facility-administered medications prior to visit.  "

## 2024-01-13 ENCOUNTER — Ambulatory Visit: Payer: Self-pay | Admitting: Medical-Surgical

## 2024-01-13 LAB — COMPREHENSIVE METABOLIC PANEL WITH GFR
ALT: 68 IU/L — ABNORMAL HIGH (ref 0–32)
AST: 40 IU/L (ref 0–40)
Albumin: 4.5 g/dL (ref 3.9–4.9)
Alkaline Phosphatase: 86 IU/L (ref 49–135)
BUN/Creatinine Ratio: 20 (ref 12–28)
BUN: 19 mg/dL (ref 8–27)
Bilirubin Total: 0.4 mg/dL (ref 0.0–1.2)
CO2: 23 mmol/L (ref 20–29)
Calcium: 9.7 mg/dL (ref 8.7–10.3)
Chloride: 101 mmol/L (ref 96–106)
Creatinine, Ser: 0.96 mg/dL (ref 0.57–1.00)
Globulin, Total: 2.2 g/dL (ref 1.5–4.5)
Glucose: 99 mg/dL (ref 70–99)
Potassium: 4.4 mmol/L (ref 3.5–5.2)
Sodium: 140 mmol/L (ref 134–144)
Total Protein: 6.7 g/dL (ref 6.0–8.5)
eGFR: 66 mL/min/1.73

## 2024-01-13 LAB — LIPID PANEL
Chol/HDL Ratio: 4.5 ratio — ABNORMAL HIGH (ref 0.0–4.4)
Cholesterol, Total: 270 mg/dL — ABNORMAL HIGH (ref 100–199)
HDL: 60 mg/dL
LDL Chol Calc (NIH): 175 mg/dL — ABNORMAL HIGH (ref 0–99)
Triglycerides: 192 mg/dL — ABNORMAL HIGH (ref 0–149)
VLDL Cholesterol Cal: 35 mg/dL (ref 5–40)

## 2024-01-13 LAB — CBC WITH DIFFERENTIAL/PLATELET
Basophils Absolute: 0 x10E3/uL (ref 0.0–0.2)
Basos: 0 %
EOS (ABSOLUTE): 0.1 x10E3/uL (ref 0.0–0.4)
Eos: 1 %
Hematocrit: 46.9 % — ABNORMAL HIGH (ref 34.0–46.6)
Hemoglobin: 15.7 g/dL (ref 11.1–15.9)
Immature Grans (Abs): 0 x10E3/uL (ref 0.0–0.1)
Immature Granulocytes: 0 %
Lymphocytes Absolute: 1.6 x10E3/uL (ref 0.7–3.1)
Lymphs: 32 %
MCH: 32 pg (ref 26.6–33.0)
MCHC: 33.5 g/dL (ref 31.5–35.7)
MCV: 96 fL (ref 79–97)
Monocytes Absolute: 0.4 x10E3/uL (ref 0.1–0.9)
Monocytes: 9 %
Neutrophils Absolute: 2.8 x10E3/uL (ref 1.4–7.0)
Neutrophils: 58 %
Platelets: 258 x10E3/uL (ref 150–450)
RBC: 4.9 x10E6/uL (ref 3.77–5.28)
RDW: 11.9 % (ref 11.7–15.4)
WBC: 4.8 x10E3/uL (ref 3.4–10.8)

## 2024-01-19 ENCOUNTER — Encounter: Payer: Self-pay | Admitting: Medical-Surgical

## 2024-01-19 MED ORDER — HYDROCODONE BIT-HOMATROP MBR 5-1.5 MG/5ML PO SOLN: 5.0000 mL | Freq: Three times a day (TID) | ORAL | 0 refills | Status: AC | PRN

## 2024-01-19 MED ORDER — BENZONATATE 200 MG PO CAPS: 200.0000 mg | ORAL_CAPSULE | Freq: Three times a day (TID) | ORAL | 0 refills | Status: AC | PRN

## 2024-01-19 MED ORDER — METHYLPREDNISOLONE 4 MG PO TBPK: ORAL_TABLET | ORAL | 0 refills | Status: AC

## 2024-01-19 NOTE — Progress Notes (Signed)
 " Cardiology Office Note:  .   Date:  01/19/2024  ID:  Susan Ramirez, DOB [birth year], MRN [id number] PCP: Willo Mini, NP  [facility] Providers Cardiologist:  Georganna Archer, MD { Chief Complaint:  Chief Complaint  Patient presents with   Hyperlipidemia    History of Present Illness: .    Susan Ramirez is a 64 y.o. female with a PMH of HTN and HLD who presents as a self-referral for hyperlipidemia.   Discussed the use of AI scribe software for clinical note transcription with the patient, who gave verbal consent to proceed.  History of Present Illness Susan Ramirez is a 64 year old female with hyperlipidemia who presents for cardiovascular risk assessment and management.  She has a history of elevated cholesterol levels, identified during a recent physical examination. Her family history is significant for cardiovascular disease, with her father having passed away from heart attacks in his sixties and was a diabetic, while her mother and grandmother died from congestive heart failure.  She has been experiencing a persistent, nagging cough since September, described as a 'hacking cough' that she cannot get rid of. Despite being prescribed cough drops and a steroid, the cough persists. It worsens when she gets hot, after eating, and when lying down.   She is currently taking lisinopril  5 mg for blood pressure, omega-3 supplements (two per day), and Zoloft . No chest pain, shortness of breath, leg swelling, or trouble breathing when lying flat. She has not been physically active recently but normally is active and does not experience chest limitations during activity.  Her diet includes fish, chicken, and beef, though she notes she does not consume much beef. She occasionally consumes alcohol and denies smoking or illicit drug use. She mentions a recent weight gain of fifteen pounds over two months, which is unusual for her.   Studies Reviewed: SABRA    EKG:  EKG  Interpretation Date/Time:  Thursday January 20 2024 09:56:58 EST Ventricular Rate:  93 PR Interval:  148 QRS Duration:  76 QT Interval:  342 QTC Calculation: 425 R Axis:   -67  Text Interpretation: Normal sinus rhythm Left axis deviation Low voltage QRS Septal infarct (cited on or before 22-Oct-2022) When compared with ECG of 22-Oct-2022 10:46, No significant change was found Confirmed by Archer Georganna [phone]) on 01/20/2024 9:59:26 AM         Results Labs LDL (01/2024): 175  Diagnostic EKG: Normal (Independently interpreted)  Risk Assessment/Calculations:               Physical Exam:    VS:  BP 126/76   Pulse 93   Ht 5' 5 (1.651 m)   Wt 190 lb 12.8 oz (86.5 kg)   LMP 10/16/2012   SpO2 100%   BMI 31.75 kg/m      Wt Readings from Last 3 Encounters:  01/12/24 188 lb 0.6 oz (85.3 kg)  10/27/23 185 lb 14.4 oz (84.3 kg)  08/24/23 186 lb (84.4 kg)     GEN: Well nourished, well developed, in no acute distress NECK: No JVD; No carotid bruits CARDIAC: RRR, no murmurs, rubs, gallops RESPIRATORY:  Clear to auscultation without rales, wheezing or rhonchi  ABDOMEN: Soft, non-tender, non-distended, normal bowel sounds EXTREMITIES:  Warm and well perfused, no edema; No deformity, 2+ radial pulses PSYCH: Normal mood and affect   ASSESSMENT AND PLAN: .    #HLD #Family History of CAD - Patient presents today for cardiovascular risk assessment and management and  treatment of HLD. -She has some concerns about starting statins but is willing to try them given her elevated LDL and cardiovascular risk. -To further restratify her I will check a lipoprotein a, high-sensitivity CRP, and a calcium  score. -Will start Crestor  20 mg daily with a low threshold to discontinue if she develops side effects. Start rosuvastatin  20 mg daily CAC score Check lipoprotein a and HS-CRP Follow-up in 3 months  #HTN #Lingering Cough - Reports having a lingering dry cough since developing URI  several months ago. - This could be related to lisinopril  even though she has been on this for several years. - We will stop her lisinopril  and switch to losartan . - BP is well-controlled. Stop lisinopril  Start losartan  25 mg daily         This note was written with the assistance of a dictation microphone or AI dictation software. Please excuse any typos or grammatical errors.   Signed, Georganna Archer, MD  01/19/2024 8:56 PM    [facility] "

## 2024-01-20 ENCOUNTER — Encounter: Payer: Self-pay | Admitting: Student in an Organized Health Care Education/Training Program

## 2024-01-20 ENCOUNTER — Ambulatory Visit
Attending: Student in an Organized Health Care Education/Training Program | Admitting: Student in an Organized Health Care Education/Training Program

## 2024-01-20 ENCOUNTER — Ambulatory Visit (HOSPITAL_COMMUNITY)
Admission: RE | Admit: 2024-01-20 | Discharge: 2024-01-20 | Disposition: A | Discharge status: Home / Self Care | Payer: Self-pay | Source: Ambulatory Visit | Attending: Student in an Organized Health Care Education/Training Program | Admitting: Student in an Organized Health Care Education/Training Program

## 2024-01-20 VITALS — BP 126/76 | HR 93 | Ht 65.0 in | Wt 190.8 lb

## 2024-01-20 DIAGNOSIS — E782 Mixed hyperlipidemia: Secondary | ICD-10-CM

## 2024-01-20 DIAGNOSIS — I10 Essential (primary) hypertension: Secondary | ICD-10-CM

## 2024-01-20 MED ORDER — LOSARTAN POTASSIUM 25 MG PO TABS: 25.0000 mg | ORAL_TABLET | Freq: Every day | ORAL | 3 refills | Status: AC

## 2024-01-20 MED ORDER — ROSUVASTATIN CALCIUM 20 MG PO TABS: 20.0000 mg | ORAL_TABLET | Freq: Every day | ORAL | 3 refills | Status: AC

## 2024-01-20 NOTE — Patient Instructions (Signed)
 Medication Instructions:  START Crestor  20 mg daily  START Losartan  25 mg daily   STOP Lisinopril    *If you need a refill on your cardiac medications before your next appointment, please call your pharmacy*  Lab Work: LP(a) HS CRP   If you have labs (blood work) drawn today and your tests are completely normal, you will receive your results only by: MyChart Message (if you have MyChart) OR A paper copy in the mail If you have any lab test that is abnormal or we need to change your treatment, we will call you to review the results.  Testing/Procedures: CALCIUM  SCORE   Your provider would like for you to have a Calcium  Score CT. This test is painless. This is a non-contrast CT of the heat to look for calcified lesions in the coronary arteries. The cost of this is test cost $99 out of pocket and is not submitted to your insurance. The test can be performed at our [facility] in [place].  You can get the test scheduled in our office or you can call [phone].  Then press option 4, Then press option 2 Finally press option 2 and get your test scheduled.    Follow-Up: At [facility], you and your health needs are our priority.  As part of our continuing mission to provide you with exceptional heart care, our providers are all part of one team.  This team includes your primary Cardiologist (physician) and Advanced Practice Providers or APPs (Physician Assistants and Nurse Practitioners) who all work together to provide you with the care you need, when you need it.  Your next appointment:   3 month(s)  Provider:   Georganna Archer, MD    We recommend signing up for the patient portal called MyChart.  Sign up information is provided on this After Visit Summary.  MyChart is used to connect with patients for Virtual Visits (Telemedicine).  Patients are able to view lab/test results, encounter notes, upcoming appointments, etc.  Non-urgent messages can  be sent to your provider as well.   To learn more about what you can do with MyChart, go to [email or web address].   Other Instructions Mediterranean Diet A Mediterranean diet is based on the traditions of countries on the [employer]. It focuses on eating more: Fruits and vegetables. Whole grains, beans, nuts, and seeds. Heart-healthy fats. These are fats that are good for your heart. It involves eating less: Dairy. Meat and eggs. Processed foods with added sugar, salt, and fat. This type of diet can help prevent certain conditions. It can also improve outcomes if you have a long-term (chronic) disease, such as kidney or heart disease. What are tips for following this plan? Reading food labels Check packaged foods for: The serving size. For foods such as rice and pasta, the serving size is the amount of cooked product, not dry. The total fat. Avoid foods with saturated fat or trans fat. Added sugars, such as corn syrup. Shopping  Try to have a balanced diet. Buy a variety of foods, such as: Fresh fruits and vegetables. You may be able to get these from local farmers markets. You can also buy them frozen. Grains, beans, nuts, and seeds. Some of these can be bought in bulk. Fresh seafood. Poultry and eggs. Low-fat dairy products. Buy whole ingredients instead of foods that have already been packaged. If you can't get fresh seafood, buy precooked frozen shrimp or canned fish, such as tuna, salmon, or sardines. Stock your  pantry so you always have certain foods on hand, such as olive oil, canned tuna, canned tomatoes, rice, pasta, and beans. Cooking Cook foods with extra-virgin olive oil instead of using butter or other vegetable oils. Have meat as a side dish. Have vegetables or grains as your main dish. This means having meat in small portions or adding small amounts of meat to foods like pasta or stew. Use beans or vegetables instead of meat in common dishes like chili or  lasagna. Try out different cooking methods. Try roasting, broiling, steaming, and sauting vegetables. Add frozen vegetables to soups, stews, pasta, or rice. Add nuts or seeds for added healthy fats and plant protein at each meal. You can add these to yogurt, salads, or vegetable dishes. Marinate fish or vegetables using olive oil, lemon juice, garlic, and fresh herbs. Meal planning Plan to eat a vegetarian meal one day each week. Try to work up to two vegetarian meals, if possible. Eat seafood two or more times a week. Have healthy snacks on hand. These may include: Vegetable sticks with hummus. Greek yogurt. Fruit and nut trail mix. Eat balanced meals. These should include: Fruit: 2-3 servings a day. Vegetables: 4-5 servings a day. Low-fat dairy: 2 servings a day. Fish, poultry, or lean meat: 1 serving a day. Beans and legumes: 2 or more servings a week. Nuts and seeds: 1-2 servings a day. Whole grains: 6-8 servings a day. Extra-virgin olive oil: 3-4 servings a day. Limit red meat and sweets to just a few servings a month. Lifestyle  Try to cook and eat meals with your family. Drink enough fluid to keep your pee (urine) pale yellow. Be active every day. This includes: Aerobic exercise, which is exercise that causes your heart to beat faster. Examples include running and swimming. Leisure activities like gardening, walking, or housework. Get 7-8 hours of sleep each night. Drink red wine if your provider says you can. A glass of wine is 5 oz (150 mL). You may be allowed to have: Up to 1 glass a day if you're female and not pregnant. Up to 2 glasses a day if you're female. What foods should I eat? Fruits Apples. Apricots. Avocado. Berries. Bananas. Cherries. Dates. Figs. Grapes. Lemons. Melon. Oranges. Peaches. Plums. Pomegranate. Vegetables Artichokes. Beets. Broccoli. Cabbage. Carrots. Eggplant. Green beans. Chard. Kale. Spinach. Onions. Leeks. Peas. Squash. Tomatoes. Peppers.  Radishes. Grains Whole-grain pasta. Brown rice. Bulgur wheat. Polenta. Couscous. Whole-wheat bread. [place] Madeira. Meats and other proteins Beans. Almonds. Sunflower seeds. Pine nuts. Peanuts. Cod. Salmon. Scallops. Shrimp. Tuna. Tilapia. Clams. Oysters. Eggs. Chicken or turkey without skin. Dairy Low-fat milk. Cheese. Greek yogurt. Fats and oils Extra-virgin olive oil. Avocado oil. Grapeseed oil. Beverages Water. Red wine. Herbal tea. Sweets and desserts Greek yogurt with honey. Baked apples. Poached pears. Trail mix. Seasonings and condiments Basil. Cilantro. Coriander. Cumin. Mint. Parsley. Sage. Rosemary. Tarragon. Garlic. Oregano. Thyme. Pepper. Balsamic vinegar. Tahini. Hummus. Tomato sauce. Olives. Mushrooms. The items listed above may not be all the foods and drinks you can have. Talk to a dietitian to learn more. What foods should I limit? This is a list of foods that should be eaten rarely. Fruits Fruit canned in syrup. Vegetables Deep-fried potatoes, like French fries. Grains Packaged pasta or rice dishes. Cereal with added sugar. Snacks with added sugar. Meats and other proteins Beef. Pork. Lamb. Chicken or turkey with skin. Hot dogs. [place]. Dairy Ice cream. Sour cream. Whole milk. Fats and oils Butter. Canola oil. Vegetable oil. Beef fat (tallow).  Lard. Beverages Juice. Sugar-sweetened soft drinks. Beer. Liquor and spirits. Sweets and desserts Cookies. Cakes. Pies. Candy. Seasonings and condiments Mayonnaise. Pre-made sauces and marinades. The items listed above may not be all the foods and drinks you should limit. Talk to a dietitian to learn more. Where to find more information American Heart Association (AHA): heart.org This information is not intended to replace advice given to you by your health care provider. Make sure you discuss any questions you have with your health care provider. Document Revised: 04/05/2022 Document Reviewed: 04/05/2022 Elsevier  Patient Education  2024 [employer].

## 2024-01-21 ENCOUNTER — Ambulatory Visit: Payer: Self-pay | Admitting: Student in an Organized Health Care Education/Training Program

## 2024-01-21 DIAGNOSIS — R911 Solitary pulmonary nodule: Secondary | ICD-10-CM

## 2024-01-22 LAB — LIPOPROTEIN A (LPA): Lipoprotein (a): 17.8 nmol/L

## 2024-01-22 LAB — HIGH SENSITIVITY CRP: CRP, High Sensitivity: 3.75 mg/L — ABNORMAL HIGH (ref 0.00–3.00)

## 2024-01-28 NOTE — Addendum Note (Signed)
 Addended by: MANDA LYLE NOVAK on: 01/28/2024 06:18 PM   Modules accepted: Orders

## 2024-02-10 ENCOUNTER — Other Ambulatory Visit: Payer: Self-pay | Admitting: Medical-Surgical

## 2024-02-10 DIAGNOSIS — E782 Mixed hyperlipidemia: Secondary | ICD-10-CM

## 2024-02-11 ENCOUNTER — Ambulatory Visit: Payer: Self-pay | Admitting: Medical-Surgical

## 2024-02-11 LAB — CMP14+EGFR
ALT: 61 [IU]/L — ABNORMAL HIGH (ref 0–32)
AST: 43 [IU]/L — ABNORMAL HIGH (ref 0–40)
Albumin: 4.5 g/dL (ref 3.9–4.9)
Alkaline Phosphatase: 77 [IU]/L (ref 49–135)
BUN/Creatinine Ratio: 19 (ref 12–28)
BUN: 19 mg/dL (ref 8–27)
Bilirubin Total: 0.4 mg/dL (ref 0.0–1.2)
CO2: 23 mmol/L (ref 20–29)
Calcium: 9.7 mg/dL (ref 8.7–10.3)
Chloride: 103 mmol/L (ref 96–106)
Creatinine, Ser: 0.99 mg/dL (ref 0.57–1.00)
Globulin, Total: 1.8 g/dL (ref 1.5–4.5)
Glucose: 80 mg/dL (ref 70–99)
Potassium: 4.4 mmol/L (ref 3.5–5.2)
Sodium: 140 mmol/L (ref 134–144)
Total Protein: 6.3 g/dL (ref 6.0–8.5)
eGFR: 64 mL/min/{1.73_m2}

## 2024-04-19 ENCOUNTER — Ambulatory Visit: Admitting: Student in an Organized Health Care Education/Training Program

## 2024-07-11 ENCOUNTER — Ambulatory Visit: Admitting: Medical-Surgical

## 2025-01-12 ENCOUNTER — Encounter: Admitting: Medical-Surgical

## 2025-01-26 ENCOUNTER — Other Ambulatory Visit (HOSPITAL_COMMUNITY)
